# Patient Record
Sex: Male | Born: 1975 | Hispanic: No | Marital: Married | Smoking: Never smoker
Health system: Southern US, Community
[De-identification: ages and names within clinical notes are randomized; demographics above are authoritative.]

---

## 2010-01-03 ENCOUNTER — Ambulatory Visit: Payer: Self-pay | Admitting: Family Medicine

## 2010-01-07 ENCOUNTER — Ambulatory Visit: Payer: Self-pay | Admitting: Family Medicine

## 2010-01-08 LAB — CONVERTED CEMR LAB
AST: 27 units/L (ref 0–37)
Albumin: 4 g/dL (ref 3.5–5.2)
Alkaline Phosphatase: 62 units/L (ref 39–117)
BUN: 13 mg/dL (ref 6–23)
Basophils Absolute: 0 10*3/uL (ref 0.0–0.1)
Bilirubin, Direct: 0.2 mg/dL (ref 0.0–0.3)
Chloride: 107 meq/L (ref 96–112)
Cholesterol: 236 mg/dL — ABNORMAL HIGH (ref 0–200)
Direct LDL: 146.9 mg/dL
Glucose, Bld: 94 mg/dL (ref 70–99)
Hemoglobin: 15.4 g/dL (ref 13.0–17.0)
Ketones, ur: NEGATIVE mg/dL
Lymphocytes Relative: 31.5 % (ref 12.0–46.0)
Monocytes Relative: 7.2 % (ref 3.0–12.0)
Neutro Abs: 4.1 10*3/uL (ref 1.4–7.7)
Neutrophils Relative %: 59 % (ref 43.0–77.0)
Potassium: 4.1 meq/L (ref 3.5–5.1)
RBC: 5.05 M/uL (ref 4.22–5.81)
RDW: 12.4 % (ref 11.5–14.6)
Specific Gravity, Urine: 1.025 (ref 1.000–1.030)
Total CHOL/HDL Ratio: 4
Total Protein, Urine: NEGATIVE mg/dL
Urine Glucose: NEGATIVE mg/dL
Urobilinogen, UA: 0.2 (ref 0.0–1.0)
VLDL: 48.4 mg/dL — ABNORMAL HIGH (ref 0.0–40.0)
pH: 6 (ref 5.0–8.0)

## 2010-09-02 ENCOUNTER — Encounter
Admission: RE | Admit: 2010-09-02 | Discharge: 2010-09-02 | Payer: Self-pay | Source: Home / Self Care | Attending: Specialist | Admitting: Specialist

## 2010-09-30 NOTE — Assessment & Plan Note (Signed)
Summary: New to establish   Vital Signs:  Patient profile:   35 year old male Height:      70.50 inches Weight:      214 pounds BMI:     30.38 Temp:     98.8 degrees F oral Pulse rate:   60 / minute Pulse rhythm:   regular Resp:     12 per minute BP sitting:   98 / 64  (left arm) Cuff size:   regular  Vitals Entered By: Sid Falcon LPN (Jan 03, 1609 3:14 PM)  Nutrition Counseling: Patient's BMI is greater than 25 and therefore counseled on weight management options. CC: New to establish   History of Present Illness: New patient to establish care.  Patient here for complete physical. He is not fasting today. Past medical history reveals no chronic illness. No prior surgeries. Takes no medications. No known drug allergies.  Family history significant for father and brother with hypertension. Uncle with diabetes. Mother died age 49 following accident.  Patient is a Sport and exercise psychologist. Nonsmoker. One alcoholic beverage per week. Married with 2 children ages 92 and 44.  Recently started exercise. Has gained approximately 10 pounds over the past couple of years. Last tetanus unknown.  Preventive Screening-Counseling & Management  Alcohol-Tobacco     Smoking Status: never  Allergies (verified): No Known Drug Allergies  Past History:  Family History: Last updated: 01/03/2010 Family History of Arthritis Breast cancer, aunt Hypertension  Brother and Father  Social History: Last updated: 01/03/2010 Occupation: Sport and exercise psychologist Married Never Smoked Alcohol use-yes  Risk Factors: Smoking Status: never (01/03/2010)  Past Surgical History: none  Family History: Family History of Arthritis Breast cancer, aunt Hypertension  Brother and Father  Social History: Occupation: Sport and exercise psychologist Married Never Smoked Alcohol use-yes Smoking Status:  never Occupation:  employed  Review of Systems  The patient denies anorexia, fever, weight loss, weight gain,  vision loss, decreased hearing, hoarseness, chest pain, syncope, dyspnea on exertion, peripheral edema, prolonged cough, headaches, hemoptysis, abdominal pain, melena, hematochezia, severe indigestion/heartburn, hematuria, incontinence, genital sores, muscle weakness, suspicious skin lesions, transient blindness, difficulty walking, depression, unusual weight change, abnormal bleeding, enlarged lymph nodes, and testicular masses.    Physical Exam  General:  Well-developed,well-nourished,in no acute distress; alert,appropriate and cooperative throughout examination Eyes:  No corneal or conjunctival inflammation noted. EOMI. Perrla. Funduscopic exam benign, without hemorrhages, exudates or papilledema. Vision grossly normal. Ears:  External ear exam shows no significant lesions or deformities.  Otoscopic examination reveals clear canals, tympanic membranes are intact bilaterally without bulging, retraction, inflammation or discharge. Hearing is grossly normal bilaterally. Mouth:  Oral mucosa and oropharynx without lesions or exudates.  Teeth in good repair. Neck:  No deformities, masses, or tenderness noted. Lungs:  Normal respiratory effort, chest expands symmetrically. Lungs are clear to auscultation, no crackles or wheezes. Heart:  Normal rate and regular rhythm. S1 and S2 normal without gallop, murmur, click, rub or other extra sounds. Abdomen:  Bowel sounds positive,abdomen soft and non-tender without masses, organomegaly or hernias noted. Genitalia:  Testes bilaterally descended without nodularity, tenderness or masses. No scrotal masses or lesions. No penis lesions or urethral discharge. Msk:  No deformity or scoliosis noted of thoracic or lumbar spine.   Extremities:  No clubbing, cyanosis, edema, or deformity noted with normal full range of motion of all joints.   Neurologic:  No cranial nerve deficits noted. Station and gait are normal. Plantar reflexes are down-going bilaterally. DTRs are  symmetrical throughout. Sensory, motor  and coordinative functions appear intact. Skin:  Intact without suspicious lesions or rashes Cervical Nodes:  No lymphadenopathy noted Psych:  normally interactive, good eye contact, not anxious appearing, and not depressed appearing.     Impression & Recommendations:  Problem # 1:  Preventive Health Care (ICD-V70.0) discussed regular exercise. Patient will schedule for screening lab work. Tetanus booster will be given.  Other Orders: Tdap => 57yrs IM (16109) Admin 1st Vaccine (60454)  Patient Instructions: 1)  It is important that you exercise reguarly at least 20 minutes 5 times a week. If you develop chest pain, have severe difficulty breathing, or feel very tired, stop exercising immediately and seek medical attention.  2)  You need to lose weight. Consider a lower calorie diet and regular exercise.  3)  Schedule the following labs: 4)  lipid, hepatic, BMP, CBC, TSH  (V70.0)    Immunizations Administered:  Tetanus Vaccine:    Vaccine Type: Tdap    Site: left deltoid    Mfr: GlaxoSmithKline    Dose: 0.5 ml    Route: IM    Given by: Sid Falcon LPN    Exp. Date: 11/23/2011    Lot #: [id number]

## 2011-05-16 IMAGING — CR DG CHEST 1V
1 series · 1 of 1 positions shown · non-contrast
Comparison: None.

CLINICAL DATA: Positive PPD

CHEST - 1 VIEW

[w chest pa]
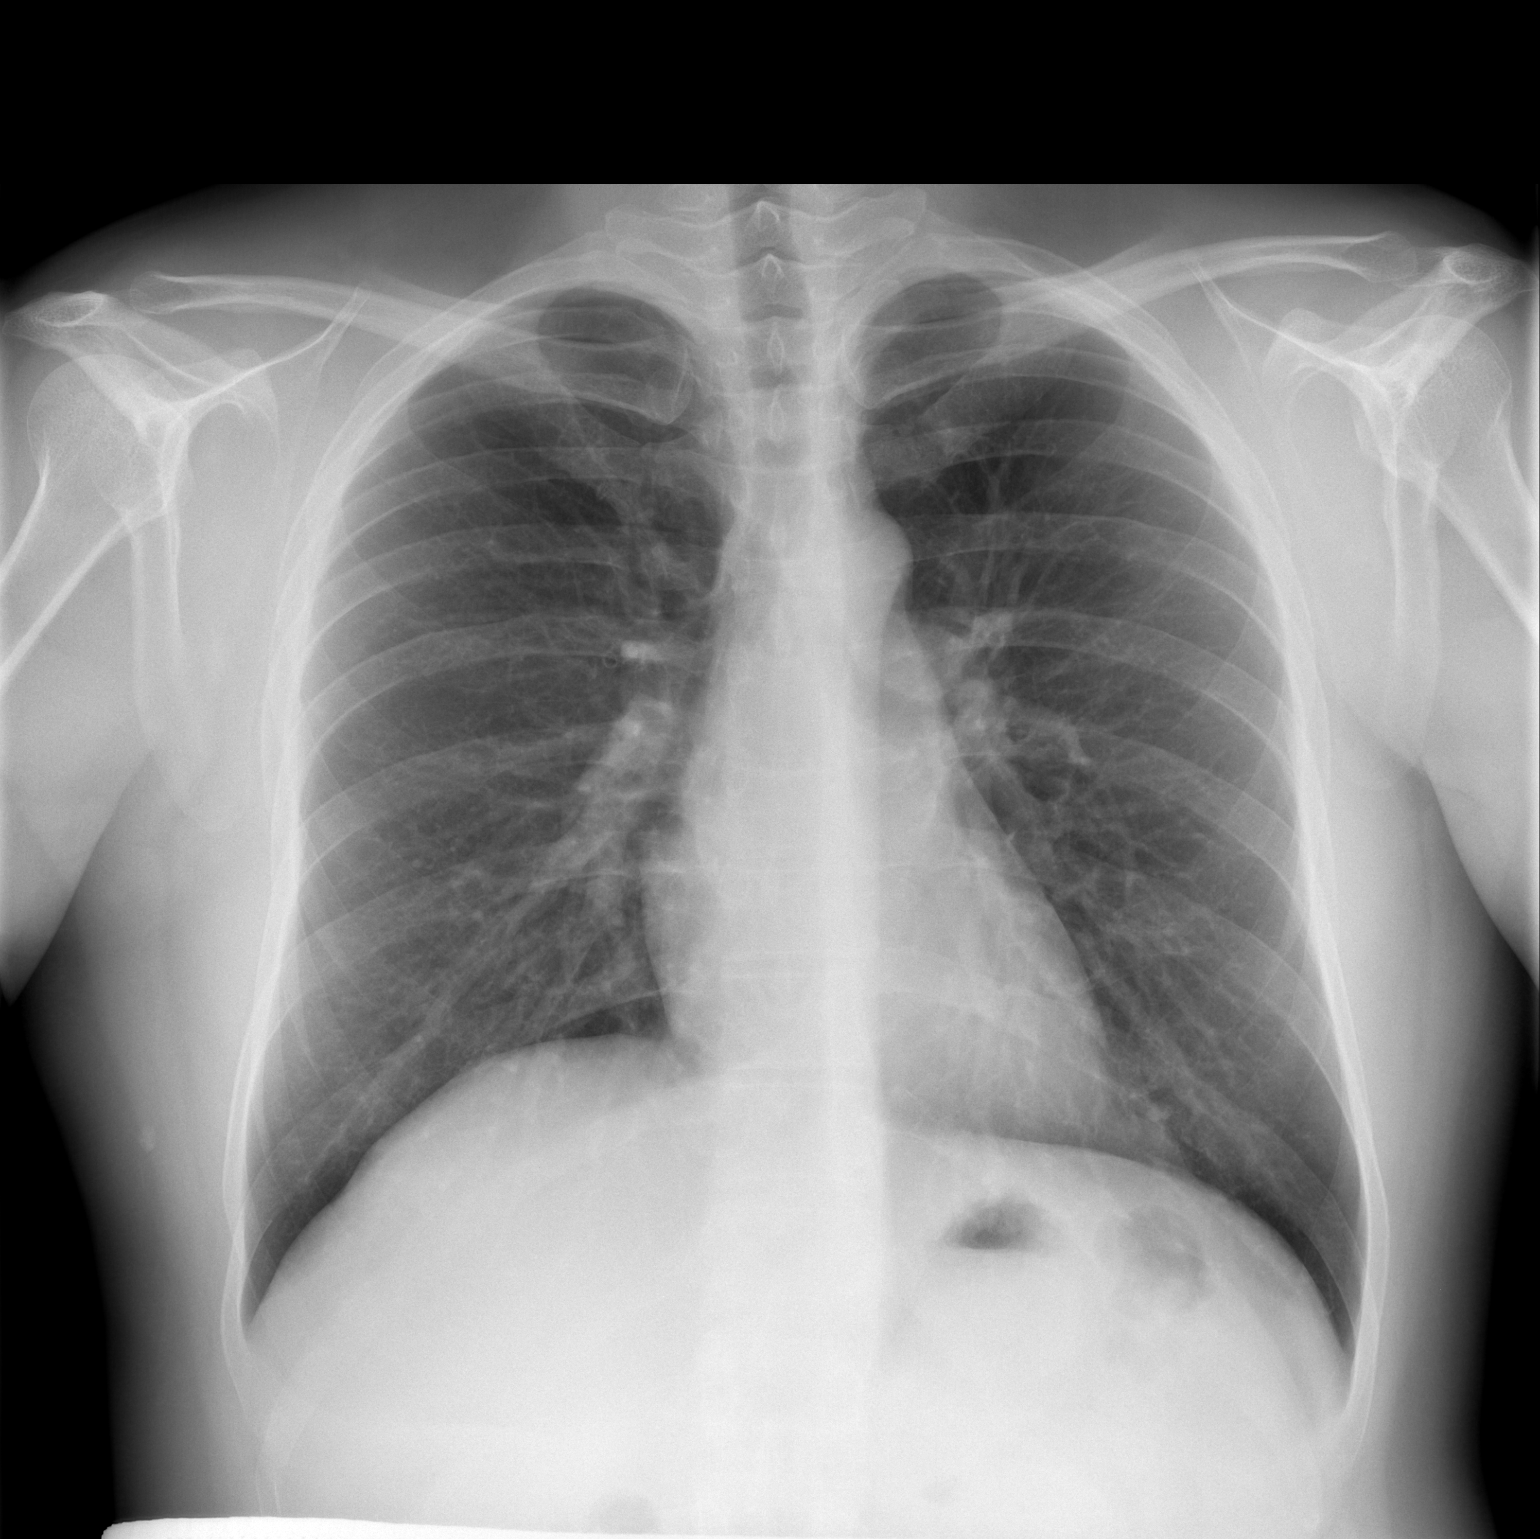

[1 of 1 positions shown; findings below may reference images not displayed]

FINDINGS: The lungs are clear.  Mediastinal contours are normal.
The heart is within normal limits in size.  No bony abnormality is
seen.
IMPRESSION: No active lung disease.

## 2011-07-30 ENCOUNTER — Other Ambulatory Visit (INDEPENDENT_AMBULATORY_CARE_PROVIDER_SITE_OTHER): Payer: 59

## 2011-07-30 DIAGNOSIS — Z0000 Encounter for general adult medical examination without abnormal findings: Secondary | ICD-10-CM

## 2011-07-30 LAB — POCT URINALYSIS DIPSTICK
Bilirubin, UA: NEGATIVE
Ketones, UA: NEGATIVE
Protein, UA: NEGATIVE
Spec Grav, UA: 1.025

## 2011-07-30 LAB — CBC WITH DIFFERENTIAL/PLATELET
Basophils Absolute: 0 10*3/uL (ref 0.0–0.1)
Basophils Relative: 0.5 % (ref 0.0–3.0)
Eosinophils Absolute: 0.1 10*3/uL (ref 0.0–0.7)
HCT: 43.8 % (ref 39.0–52.0)
Hemoglobin: 15.1 g/dL (ref 13.0–17.0)
Lymphs Abs: 1.9 10*3/uL (ref 0.7–4.0)
MCHC: 34.4 g/dL (ref 30.0–36.0)
Neutro Abs: 4.8 10*3/uL (ref 1.4–7.7)
RBC: 4.99 Mil/uL (ref 4.22–5.81)
RDW: 12.4 % (ref 11.5–14.6)

## 2011-07-30 LAB — BASIC METABOLIC PANEL
CO2: 24 mEq/L (ref 19–32)
Chloride: 109 mEq/L (ref 96–112)
Glucose, Bld: 97 mg/dL (ref 70–99)
Potassium: 4.1 mEq/L (ref 3.5–5.1)
Sodium: 141 mEq/L (ref 135–145)

## 2011-07-30 LAB — LDL CHOLESTEROL, DIRECT: Direct LDL: 151.3 mg/dL

## 2011-07-30 LAB — LIPID PANEL
HDL: 56.9 mg/dL (ref >39.00)
Triglycerides: 288 mg/dL — ABNORMAL HIGH (ref 0.0–149.0)

## 2011-07-30 LAB — HEPATIC FUNCTION PANEL
AST: 25 U/L (ref 0–37)
Albumin: 4.2 g/dL (ref 3.5–5.2)
Alkaline Phosphatase: 61 U/L (ref 39–117)
Total Protein: 7.7 g/dL (ref 6.0–8.3)

## 2011-07-30 LAB — TSH: TSH: 1.3 u[IU]/mL (ref 0.35–5.50)

## 2011-08-06 ENCOUNTER — Ambulatory Visit (INDEPENDENT_AMBULATORY_CARE_PROVIDER_SITE_OTHER): Payer: 59 | Admitting: Family Medicine

## 2011-08-06 ENCOUNTER — Encounter: Payer: Self-pay | Admitting: Family Medicine

## 2011-08-06 VITALS — BP 110/80 | HR 72 | Temp 99.1°F | Resp 12 | Ht 71.0 in | Wt 222.0 lb

## 2011-08-06 DIAGNOSIS — Z0000 Encounter for general adult medical examination without abnormal findings: Secondary | ICD-10-CM

## 2011-08-06 DIAGNOSIS — Z23 Encounter for immunization: Secondary | ICD-10-CM

## 2011-08-06 DIAGNOSIS — R319 Hematuria, unspecified: Secondary | ICD-10-CM

## 2011-08-06 NOTE — Patient Instructions (Signed)
Work on weight loss and reduction of saturated fats in diet Consider repeat lipid panel in one year Schedule followup in approximately one month for repeat urinalysis

## 2011-08-06 NOTE — Progress Notes (Signed)
  Subjective:    Patient ID: Jose Barker, male    DOB: 09/17/1975, 35 y.o.   MRN: [id number]  HPI  Patient seen for a complete physical. No chronic medical problems. No prior surgeries. Takes no medications. Tetanus last year. No flu vaccine yet. No regular exercise and has gained about 12 poinds this last year. Plans to start exercising. Past medical history family history reviewed as below. Patient nonsmoker. No regular alcohol use.  History reviewed. No pertinent past medical history. History reviewed. No pertinent past surgical history.  reports that he has never smoked. He does not have any smokeless tobacco history on file. His alcohol and drug histories not on file. family history includes Arthritis in his other; Cancer in his other; Hypertension in his brother and father; and Parkinsonism in his father. No Known Allergies   Review of Systems  Constitutional: Negative for fever, chills, activity change, appetite change, fatigue and unexpected weight change.  HENT: Negative for ear pain, congestion and trouble swallowing.   Eyes: Negative for pain and visual disturbance.  Respiratory: Negative for cough, shortness of breath and wheezing.   Cardiovascular: Negative for chest pain and palpitations.  Gastrointestinal: Negative for nausea, vomiting, abdominal pain, diarrhea, constipation, blood in stool, abdominal distention and rectal pain.  Genitourinary: Negative for dysuria, hematuria and testicular pain.  Musculoskeletal: Negative for joint swelling and arthralgias.  Skin: Negative for rash.  Neurological: Negative for dizziness, syncope and headaches.  Hematological: Negative for adenopathy.  Psychiatric/Behavioral: Negative for confusion and dysphoric mood.       Objective:   Physical Exam  Constitutional: He is oriented to person, place, and time. He appears well-developed and well-nourished. No distress.  HENT:  Head: Normocephalic and atraumatic.  Right Ear: External ear  normal.  Left Ear: External ear normal.  Mouth/Throat: Oropharynx is clear and moist.  Eyes: Conjunctivae and EOM are normal. Pupils are equal, round, and reactive to light.  Neck: Normal range of motion. Neck supple. No thyromegaly present.  Cardiovascular: Normal rate, regular rhythm and normal heart sounds.   No murmur heard. Pulmonary/Chest: No respiratory distress. He has no wheezes. He has no rales.  Abdominal: Soft. Bowel sounds are normal. He exhibits no distension and no mass. There is no tenderness. There is no rebound and no guarding.  Musculoskeletal: He exhibits no edema.  Lymphadenopathy:    He has no cervical adenopathy.  Neurological: He is alert and oriented to person, place, and time. He displays normal reflexes. No cranial nerve deficit.  Skin: No rash noted.  Psychiatric: He has a normal mood and affect.          Assessment & Plan:  Complete physical. Labs reviewed with patient. Moderately elevated lipids. Needs to lose some weight. Discussed diet and exercise in detail. Repeat urine dipstick and 2-3 weeks to make sure hematuria clearing-doubt significant with only trace blood . Flu vaccine given.

## 2011-09-11 ENCOUNTER — Other Ambulatory Visit (INDEPENDENT_AMBULATORY_CARE_PROVIDER_SITE_OTHER): Payer: 59

## 2011-09-11 DIAGNOSIS — R319 Hematuria, unspecified: Secondary | ICD-10-CM

## 2011-09-11 LAB — POCT URINALYSIS DIPSTICK
Bilirubin, UA: NEGATIVE
Glucose, UA: NEGATIVE
Ketones, UA: NEGATIVE
Leukocytes, UA: NEGATIVE
Nitrite, UA: NEGATIVE
Protein, UA: NEGATIVE
Spec Grav, UA: 1.01
Urobilinogen, UA: 0.2
pH, UA: 6

## 2012-08-03 ENCOUNTER — Other Ambulatory Visit (INDEPENDENT_AMBULATORY_CARE_PROVIDER_SITE_OTHER): Payer: 59

## 2012-08-03 DIAGNOSIS — Z0000 Encounter for general adult medical examination without abnormal findings: Secondary | ICD-10-CM

## 2012-08-03 LAB — HEPATIC FUNCTION PANEL
Albumin: 4.1 g/dL (ref 3.5–5.2)
Alkaline Phosphatase: 63 U/L (ref 39–117)
Bilirubin, Direct: 0.1 mg/dL (ref 0.0–0.3)
Total Bilirubin: 0.8 mg/dL (ref 0.3–1.2)
Total Protein: 7.4 g/dL (ref 6.0–8.3)

## 2012-08-03 LAB — BASIC METABOLIC PANEL
BUN: 13 mg/dL (ref 6–23)
GFR: 111.09 mL/min (ref >60.00)
Potassium: 3.9 mEq/L (ref 3.5–5.1)
Sodium: 138 mEq/L (ref 135–145)

## 2012-08-03 LAB — LIPID PANEL
Cholesterol: 232 mg/dL — ABNORMAL HIGH (ref 0–200)
Triglycerides: 252 mg/dL — ABNORMAL HIGH (ref 0.0–149.0)

## 2012-08-03 LAB — CBC WITH DIFFERENTIAL/PLATELET
Eosinophils Relative: 1.5 % (ref 0.0–5.0)
HCT: 45.3 % (ref 39.0–52.0)
Lymphs Abs: 1.8 10*3/uL (ref 0.7–4.0)
Monocytes Relative: 8 % (ref 3.0–12.0)
Platelets: 240 10*3/uL (ref 150.0–400.0)
WBC: 5.9 10*3/uL (ref 4.5–10.5)

## 2012-08-03 LAB — POCT URINALYSIS DIPSTICK
Glucose, UA: NEGATIVE
Leukocytes, UA: NEGATIVE
Nitrite, UA: NEGATIVE
Protein, UA: NEGATIVE
Urobilinogen, UA: 0.2

## 2012-08-09 ENCOUNTER — Encounter: Payer: Self-pay | Admitting: Family Medicine

## 2012-08-09 ENCOUNTER — Ambulatory Visit (INDEPENDENT_AMBULATORY_CARE_PROVIDER_SITE_OTHER): Payer: 59 | Admitting: Family Medicine

## 2012-08-09 VITALS — BP 110/78 | HR 72 | Temp 98.3°F | Resp 12 | Ht 70.75 in | Wt 225.0 lb

## 2012-08-09 DIAGNOSIS — M722 Plantar fascial fibromatosis: Secondary | ICD-10-CM

## 2012-08-09 DIAGNOSIS — Z0000 Encounter for general adult medical examination without abnormal findings: Secondary | ICD-10-CM

## 2012-08-09 DIAGNOSIS — L219 Seborrheic dermatitis, unspecified: Secondary | ICD-10-CM

## 2012-08-09 MED ORDER — FLUOCINOLONE ACETONIDE 0.01 % EX SHAM: 1.0000 "application " | MEDICATED_SHAMPOO | Freq: Every day | CUTANEOUS | Status: DC | PRN

## 2012-08-09 NOTE — Progress Notes (Signed)
Subjective:    Patient ID: Jose Barker, male    DOB: 10-Mar-1976, 36 y.o.   MRN: [id number]  HPI  Patient here for complete physical. He has no specific chronic medical problems. Takes no medications. He has recently acquired a treadmill and plans to start exercising soon. Had steady weight gain within past few years. 3 pound weight gain since last year. Tetanus up-to-date. Already had flu vaccine.  Has problems with seborrhea and scaling of the scalp. Minimal itching. Tried some type of conditioning shampoo without much improvement. He has some mild diffuse hair loss and states is a genetic trait in his family.  Left heel pain over the past several weeks. No injury. Pain is worse after prolonged periods of sitting or rest. Location is calcaneus. No Achilles pain. Is not stretching. No alleviating factors.  No past medical history on file. No past surgical history on file.  reports that he has never smoked. He does not have any smokeless tobacco history on file. His alcohol and drug histories not on file. family history includes Arthritis in his other; Cancer in his other; Cancer (age of onset:42) in his sister; Hypertension in his brother and father; and Parkinsonism in his father. No Known Allergies    Review of Systems  Constitutional: Negative for fever, activity change, appetite change and fatigue.  HENT: Negative for ear pain, congestion and trouble swallowing.   Eyes: Negative for pain and visual disturbance.  Respiratory: Negative for cough, shortness of breath and wheezing.   Cardiovascular: Negative for chest pain and palpitations.  Gastrointestinal: Negative for nausea, vomiting, abdominal pain, diarrhea, constipation, blood in stool, abdominal distention and rectal pain.  Genitourinary: Negative for dysuria, hematuria and testicular pain.  Musculoskeletal: Negative for joint swelling and arthralgias.  Skin: Negative for rash.  Neurological: Negative for dizziness, syncope  and headaches.  Hematological: Negative for adenopathy.  Psychiatric/Behavioral: Negative for confusion and dysphoric mood.       Objective:   Physical Exam  Constitutional: He is oriented to person, place, and time. He appears well-developed and well-nourished. No distress.  HENT:  Head: Normocephalic and atraumatic.  Right Ear: External ear normal.  Left Ear: External ear normal.  Mouth/Throat: Oropharynx is clear and moist.  Eyes: Conjunctivae normal and EOM are normal. Pupils are equal, round, and reactive to light.  Neck: Normal range of motion. Neck supple. No thyromegaly present.  Cardiovascular: Normal rate, regular rhythm and normal heart sounds.   No murmur heard. Pulmonary/Chest: No respiratory distress. He has no wheezes. He has no rales.  Abdominal: Soft. Bowel sounds are normal. He exhibits no distension and no mass. There is no tenderness. There is no rebound and no guarding.  Musculoskeletal: He exhibits no edema.       Tender plantar fascia.  No achilles tenderness.  Full ROM ankle.  Lymphadenopathy:    He has no cervical adenopathy.  Neurological: He is alert and oriented to person, place, and time. He displays normal reflexes. No cranial nerve deficit.  Skin: No rash noted.       Mild seborrhea,scaling of scalp  Psychiatric: He has a normal mood and affect.          Assessment & Plan:  Complete physical. Labs reviewed with patient with no specific concerns. Does have mildly elevated blood sugar 101 which is up from last year. We discussed the importance of weight loss and establishing more consistent exercise. He recently acquired treadmill and plans to start program soon.  Plantar fasciitis. We  recommended stretches with instructions given. He'll consider over-the-counter heel cup. Consider orthotics if symptoms persist  seborrhea involving scalp. Try over-the-counter T-Gel or Selsun Blue shampoos. Prescription for Capex shampoo if these are not relieving

## 2012-08-09 NOTE — Patient Instructions (Addendum)
Plantar Fasciitis Plantar fasciitis is a common condition that causes foot pain. It is soreness (inflammation) of the band of tough fibrous tissue on the bottom of the foot that runs from the heel bone (calcaneus) to the ball of the foot. The cause of this soreness may be from excessive standing, poor fitting shoes, running on hard surfaces, being overweight, having an abnormal walk, or overuse (this is common in runners) of the painful foot or feet. It is also common in aerobic exercise dancers and ballet dancers. SYMPTOMS  Most people with plantar fasciitis complain of:  Severe pain in the morning on the bottom of their foot especially when taking the first steps out of bed. This pain recedes after a few minutes of walking.  Severe pain is experienced also during walking following a long period of inactivity.  Pain is worse when walking barefoot or up stairs DIAGNOSIS   Your caregiver will diagnose this condition by examining and feeling your foot.  Special tests such as X-rays of your foot, are usually not needed. PREVENTION   Consult a sports medicine professional before beginning a new exercise program.  Walking programs offer a good workout. With walking there is a lower chance of overuse injuries common to runners. There is less impact and less jarring of the joints.  Begin all new exercise programs slowly. If problems or pain develop, decrease the amount of time or distance until you are at a comfortable level.  Wear good shoes and replace them regularly.  Stretch your foot and the heel cords at the back of the ankle (Achilles tendon) both before and after exercise.  Run or exercise on even surfaces that are not hard. For example, asphalt is better than pavement.  Do not run barefoot on hard surfaces.  If using a treadmill, vary the incline.  Do not continue to workout if you have foot or joint problems. Seek professional help if they do not improve. HOME CARE INSTRUCTIONS     Avoid activities that cause you pain until you recover.  Use ice or cold packs on the problem or painful areas after working out.  Only take over-the-counter or prescription medicines for pain, discomfort, or fever as directed by your caregiver.  Soft shoe inserts or athletic shoes with air or gel sole cushions may be helpful.  If problems continue or become more severe, consult a sports medicine caregiver or your own health care provider. Cortisone is a potent anti-inflammatory medication that may be injected into the painful area. You can discuss this treatment with your caregiver. MAKE SURE YOU:   Understand these instructions.  Will watch your condition.  Will get help right away if you are not doing well or get worse. Document Released: 05/12/2001 Document Revised: 11/09/2011 Document Reviewed: 07/11/2008 [facility] Patient Information 2013 [place], [place].  Consider T-gel or Selsun Blue dandriff shampoos.

## 2012-12-20 ENCOUNTER — Ambulatory Visit (INDEPENDENT_AMBULATORY_CARE_PROVIDER_SITE_OTHER): Payer: 59 | Admitting: Family Medicine

## 2012-12-20 ENCOUNTER — Encounter: Payer: Self-pay | Admitting: Family Medicine

## 2012-12-20 VITALS — BP 100/60 | HR 88 | Temp 98.2°F | Wt 204.0 lb

## 2012-12-20 DIAGNOSIS — T148XXA Other injury of unspecified body region, initial encounter: Secondary | ICD-10-CM

## 2012-12-20 NOTE — Progress Notes (Addendum)
Chief Complaint  Patient presents with  . Fall    felt dizzy during the night; hurt his ribs on the right side; had advil for pain    HPI:  Acute visit for ? Rib injury: -fell when got up to urinate last night and hit right side and has had pain here -reports for at least 15 years since a teenager sometimes feel a little dizzy when urinating (especially when gets up suddenly at night) - never with LOC, CP, SOB or palpitations - this was what happened last night -pain is soreness on R lower rib area -motrin helps pain, slept well -feels ok right now on advil -hurts with certain movements  ROS: See pertinent positives and negatives per HPI.  No past medical history on file.  Family History  Problem Relation Age of Onset  . Hypertension Father   . Parkinsonism Father   . Hypertension Brother   . Arthritis Other   . Cancer Other     breast, aunt  . Cancer Sister 75    breast cancer    History   Social History  . Marital Status: Married    Spouse Name: N/A    Number of Children: N/A  . Years of Education: N/A   Social History Main Topics  . Smoking status: Never Smoker   . Smokeless tobacco: None  . Alcohol Use: None  . Drug Use: None  . Sexually Active: None   Other Topics Concern  . None   Social History Narrative  . None    Current outpatient prescriptions:Fluocinolone Acetonide (CAPEX) 0.01 % SHAM, Apply 1 application topically daily as needed., Disp: 1 Bottle, Rfl: 2;  Multiple Vitamin (MULTI VITAMIN MENS PO), Take by mouth daily., Disp: , Rfl:   EXAM:  Filed Vitals:   12/20/12 0915  BP: 100/60  Pulse: 88  Temp: 98.2 F (36.8 C)    Body mass index is 28.66 kg/(m^2).  GENERAL: vitals reviewed and listed above, alert, oriented, appears well hydrated and in no acute distress  HEENT: atraumatic, conjunttiva clear, no obvious abnormalities on inspection of external nose and ears  NECK: no obvious masses on inspection  LUNGS: clear to auscultation  bilaterally, no wheezes, rales or rhonchi, good air movement  CV: HRRR, no peripheral edema  MS: moves all extremities without noticeable abnormality Mild defuse TTP soft tissues R side of mid and lower ribs in axillary line, no bony TTP  PSYCH: pleasant and cooperative, no obvious depression or anxiety  ASSESSMENT AND PLAN:  Discussed the following assessment and plan:  Contusion of soft tissue  -advised heat, ibuprofen or tylenol, gentle stretching - doubt any rib fx per exam but offered xray - pt will hold of on this. Follow up with PCP in 3 weeks. -sounds like he has had a mild micturition related presyncope his whole life, not new and do not think further workup needed at this time, he agreed. -Patient advised to return or notify a doctor immediately if symptoms worsen or persist or new concerns arise.  There are no Patient Instructions on file for this visit.   Kriste Basque R.

## 2013-05-21 ENCOUNTER — Encounter (HOSPITAL_COMMUNITY): Payer: Self-pay | Admitting: *Deleted

## 2013-05-21 ENCOUNTER — Emergency Department (HOSPITAL_COMMUNITY)
Admission: EM | Admit: 2013-05-21 | Discharge: 2013-05-21 | Disposition: A | Discharge status: Home / Self Care | Payer: 59 | Attending: Emergency Medicine | Admitting: Emergency Medicine

## 2013-05-21 DIAGNOSIS — Z23 Encounter for immunization: Secondary | ICD-10-CM | POA: Insufficient documentation

## 2013-05-21 DIAGNOSIS — S61409A Unspecified open wound of unspecified hand, initial encounter: Secondary | ICD-10-CM | POA: Insufficient documentation

## 2013-05-21 DIAGNOSIS — Y929 Unspecified place or not applicable: Secondary | ICD-10-CM | POA: Insufficient documentation

## 2013-05-21 DIAGNOSIS — Z79899 Other long term (current) drug therapy: Secondary | ICD-10-CM | POA: Insufficient documentation

## 2013-05-21 DIAGNOSIS — Y939 Activity, unspecified: Secondary | ICD-10-CM | POA: Insufficient documentation

## 2013-05-21 DIAGNOSIS — W268XXA Contact with other sharp object(s), not elsewhere classified, initial encounter: Secondary | ICD-10-CM | POA: Insufficient documentation

## 2013-05-21 DIAGNOSIS — S61412A Laceration without foreign body of left hand, initial encounter: Secondary | ICD-10-CM

## 2013-05-21 MED ORDER — TETANUS-DIPHTH-ACELL PERTUSSIS 5-2.5-18.5 LF-MCG/0.5 IM SUSP
0.5000 mL | Freq: Once | INTRAMUSCULAR | Status: AC
  Administered 2013-05-21: 0.5 mL via INTRAMUSCULAR
  Filled 2013-05-21: qty 0.5

## 2013-05-21 NOTE — ED Notes (Signed)
The pt lacerated his lt thumb on a glass 2 hours ago.. Web space of his thumb and index finger

## 2013-05-21 NOTE — ED Provider Notes (Signed)
Medical screening examination/treatment/procedure(s) were performed by non-physician practitioner and as supervising physician I was immediately available for consultation/collaboration.   Joya Gaskins, MD 05/21/13 [phone]

## 2013-05-21 NOTE — ED Provider Notes (Signed)
CSN: [id number]     Arrival date & time 05/21/13  0009 History   First MD Initiated Contact with Patient 05/21/13 0024     Chief Complaint  Patient presents with  . Laceration   (Consider location/radiation/quality/duration/timing/severity/associated sxs/prior Treatment) HPI Comments: Patient is a 37 year old male who presents with a laceration of his left hand that occurred prior to arrival. Patient states he was holding a glass that broke, resulting in a laceration of his left hand. Patient reports mild to moderate pain at the laceration site that started suddenly. Patient reports associated bleeding. Patient held pressure on the wound to control bleeding. No aggravating/alleviating factors. No other injury.   Patient is a 37 y.o. male presenting with skin laceration.  Laceration   History reviewed. No pertinent past medical history. History reviewed. No pertinent past surgical history. Family History  Problem Relation Age of Onset  . Hypertension Father   . Parkinsonism Father   . Hypertension Brother   . Arthritis Other   . Cancer Other     breast, aunt  . Cancer Sister 56    breast cancer   History  Substance Use Topics  . Smoking status: Never Smoker   . Smokeless tobacco: Not on file  . Alcohol Use: Yes    Review of Systems  Skin: Positive for wound.  All other systems reviewed and are negative.    Allergies  Review of patient's allergies indicates no known allergies.  Home Medications   Current Outpatient Rx  Name  Route  Sig  Dispense  Refill  . Fluocinolone Acetonide (CAPEX) 0.01 % SHAM   Apply externally   Apply 1 application topically daily as needed.   1 Bottle   2   . Multiple Vitamin (MULTI VITAMIN MENS PO)   Oral   Take by mouth daily.          BP 156/83  Pulse 124  Temp(Src) 98.9 F (37.2 C) (Oral)  Resp 16  SpO2 99% Physical Exam  Nursing note and vitals reviewed. Constitutional: He is oriented to person, place, and time. He  appears well-developed and well-nourished. No distress.  HENT:  Head: Normocephalic and atraumatic.  Eyes: Conjunctivae are normal.  Neck: Normal range of motion.  Cardiovascular: Normal rate, regular rhythm and intact distal pulses.  Exam reveals no gallop and no friction rub.   No murmur heard. Sufficient capillary refill of left hand digits.   Pulmonary/Chest: Effort normal and breath sounds normal. He has no wheezes. He has no rales. He exhibits no tenderness.  Musculoskeletal: Normal range of motion.  Full ROM of joints of left hand.   Neurological: He is alert and oriented to person, place, and time. Coordination normal.  Left hand sensation intact. Speech is goal-oriented. Moves limbs without ataxia.   Skin: Skin is warm and dry.  2.5 cm laceration of left hand in interspace of thumb and index finger on volar aspect of hand. Bleeding controlled.   Psychiatric: He has a normal mood and affect. His behavior is normal.    ED Course  Procedures (including critical care time)  LACERATION REPAIR Performed by: Emilia Beck Authorized by: Emilia Beck Consent: Verbal consent obtained. Risks and benefits: risks, benefits and alternatives were discussed Consent given by: patient Patient identity confirmed: provided demographic data Prepped and Draped in normal sterile fashion Wound explored  Laceration Location: left hand thumb and index finger interspace  Laceration Length: 2.5 cm  No Foreign Bodies seen or palpated  Anesthesia: local  infiltration  Local anesthetic: lidocaine 2% without epinephrine  Anesthetic total: 3 ml  Irrigation method: syringe Amount of cleaning: standard  Skin closure: 4-0 prolene  Number of sutures: 3  Technique: simple  Patient tolerance: Patient tolerated the procedure well with no immediate complications.   Labs Review Labs Reviewed - No data to display Imaging Review No results found.  MDM   1. Laceration of left hand,  initial encounter     1:27 AM Laceration repaired without complication and bandage applied. No foreign bodies noted in wound, as it was thoroughly explored. Patient receive tdap vaccine here. No neurovascular compromise. Patient instructed to return in 10 days for suture removal.     Emilia Beck, PA-C 05/21/13 [phone]

## 2013-05-30 ENCOUNTER — Encounter: Payer: Self-pay | Admitting: Family Medicine

## 2013-05-30 ENCOUNTER — Ambulatory Visit (INDEPENDENT_AMBULATORY_CARE_PROVIDER_SITE_OTHER): Payer: 59 | Admitting: Family Medicine

## 2013-05-30 VITALS — BP 112/70 | HR 83 | Temp 98.4°F | Wt 217.0 lb

## 2013-05-30 DIAGNOSIS — S61409A Unspecified open wound of unspecified hand, initial encounter: Secondary | ICD-10-CM

## 2013-05-30 DIAGNOSIS — S61412A Laceration without foreign body of left hand, initial encounter: Secondary | ICD-10-CM

## 2013-05-30 NOTE — Progress Notes (Signed)
  Subjective:    Patient ID: Jose Barker, male    DOB: 07/26/1976, 37 y.o.   MRN: [id number]  HPI  Recent laceration left hand about 10 days ago. This was cut accidentally on an ice maker. He was given tetanus booster. He was seen in the emergency department. He had no pain and no difficulties with healing. No drainage.  No past medical history on file. No past surgical history on file.  reports that he has never smoked. He does not have any smokeless tobacco history on file. He reports that  drinks alcohol. His drug history is not on file. family history includes Arthritis in his other; Cancer in his other; Cancer (age of onset: 85) in his sister; Hypertension in his brother and father; Parkinsonism in his father. No Known Allergies   Review of Systems  Constitutional: Negative for fever and chills.       Objective:   Physical Exam  Constitutional: He appears well-developed and well-nourished.  Skin:  Left hand reveals laceration in the webspace between the thumb and index finger. This is healing well. 3 sutures are removed. No signs of infection. Wound is partially opened up in one section and Steri-Strips are applied          Assessment & Plan:  Left hand laceration healing well. Sutures removed. Steri-Strips applied. Followup as needed

## 2014-04-10 ENCOUNTER — Other Ambulatory Visit (INDEPENDENT_AMBULATORY_CARE_PROVIDER_SITE_OTHER): Payer: 59

## 2014-04-10 DIAGNOSIS — Z0000 Encounter for general adult medical examination without abnormal findings: Secondary | ICD-10-CM

## 2014-04-10 LAB — HEPATIC FUNCTION PANEL
ALT: 22 U/L (ref 0–53)
AST: 20 U/L (ref 0–37)
Albumin: 3.9 g/dL (ref 3.5–5.2)
Alkaline Phosphatase: 58 U/L (ref 39–117)
Bilirubin, Direct: 0 mg/dL (ref 0.0–0.3)
Total Bilirubin: 0.6 mg/dL (ref 0.2–1.2)
Total Protein: 7.2 g/dL (ref 6.0–8.3)

## 2014-04-10 LAB — BASIC METABOLIC PANEL
BUN: 8 mg/dL (ref 6–23)
CO2: 22 mEq/L (ref 19–32)
Calcium: 9.2 mg/dL (ref 8.4–10.5)
Chloride: 107 mEq/L (ref 96–112)
Creatinine, Ser: 0.9 mg/dL (ref 0.4–1.5)
GFR: 104.26 mL/min (ref >60.00)
GLUCOSE: 90 mg/dL (ref 70–99)
POTASSIUM: 4 meq/L (ref 3.5–5.1)
SODIUM: 138 meq/L (ref 135–145)

## 2014-04-10 LAB — CBC WITH DIFFERENTIAL/PLATELET
BASOS ABS: 0 10*3/uL (ref 0.0–0.1)
Basophils Relative: 0.5 % (ref 0.0–3.0)
EOS PCT: 1.5 % (ref 0.0–5.0)
Eosinophils Absolute: 0.1 10*3/uL (ref 0.0–0.7)
HEMATOCRIT: 44.9 % (ref 39.0–52.0)
HEMOGLOBIN: 15.1 g/dL (ref 13.0–17.0)
LYMPHS ABS: 2.3 10*3/uL (ref 0.7–4.0)
Lymphocytes Relative: 35.6 % (ref 12.0–46.0)
MCHC: 33.7 g/dL (ref 30.0–36.0)
MCV: 87 fl (ref 78.0–100.0)
MONOS PCT: 5.9 % (ref 3.0–12.0)
Monocytes Absolute: 0.4 10*3/uL (ref 0.1–1.0)
Neutro Abs: 3.7 10*3/uL (ref 1.4–7.7)
Neutrophils Relative %: 56.5 % (ref 43.0–77.0)
Platelets: 225 10*3/uL (ref 150.0–400.0)
RBC: 5.16 Mil/uL (ref 4.22–5.81)
RDW: 12.6 % (ref 11.5–15.5)
WBC: 6.5 10*3/uL (ref 4.0–10.5)

## 2014-04-10 LAB — POCT URINALYSIS DIPSTICK
Bilirubin, UA: NEGATIVE
Blood, UA: NEGATIVE
Glucose, UA: NEGATIVE
Ketones, UA: NEGATIVE
LEUKOCYTES UA: NEGATIVE
NITRITE UA: NEGATIVE
PROTEIN UA: NEGATIVE
Spec Grav, UA: 1.02
Urobilinogen, UA: 0.2
pH, UA: 6

## 2014-04-10 LAB — LIPID PANEL
CHOL/HDL RATIO: 4
Cholesterol: 233 mg/dL — ABNORMAL HIGH (ref 0–200)
HDL: 52 mg/dL (ref >39.00)
NonHDL: 181
Triglycerides: 354 mg/dL — ABNORMAL HIGH (ref 0.0–149.0)
VLDL: 70.8 mg/dL — AB (ref 0.0–40.0)

## 2014-04-10 LAB — TSH: TSH: 2.1 u[IU]/mL (ref 0.35–4.50)

## 2014-04-10 LAB — LDL CHOLESTEROL, DIRECT: Direct LDL: 157.3 mg/dL

## 2014-04-18 ENCOUNTER — Ambulatory Visit (INDEPENDENT_AMBULATORY_CARE_PROVIDER_SITE_OTHER): Payer: 59 | Admitting: Family Medicine

## 2014-04-18 ENCOUNTER — Encounter: Payer: Self-pay | Admitting: Family Medicine

## 2014-04-18 VITALS — BP 128/70 | HR 76 | Temp 98.1°F | Ht 70.0 in | Wt 220.0 lb

## 2014-04-18 DIAGNOSIS — Z0000 Encounter for general adult medical examination without abnormal findings: Secondary | ICD-10-CM

## 2014-04-18 NOTE — Progress Notes (Signed)
   Subjective:    Patient ID: Jose Barker, male    DOB: 11-Dec-1975, 38 y.o.   MRN: [id number]  HPI Patient here for complete physical. Generally very healthy. He has had history of some obesity but has actually lost about 5 pounds last year. No consistent exercise. Takes no regular medications. Tetanus up-to-date.  No past medical history on file. No past surgical history on file.  reports that he has never smoked. He does not have any smokeless tobacco history on file. He reports that he drinks alcohol. His drug history is not on file. family history includes Arthritis in his other; Cancer in his other; Cancer (age of onset: 6442) in his sister; Hypertension in his brother and father; Parkinsonism in his father. No Known Allergies    Review of Systems  Constitutional: Negative for fever, activity change, appetite change and fatigue.  HENT: Negative for congestion, ear pain and trouble swallowing.   Eyes: Negative for pain and visual disturbance.  Respiratory: Negative for cough, shortness of breath and wheezing.   Cardiovascular: Negative for chest pain and palpitations.  Gastrointestinal: Negative for nausea, vomiting, abdominal pain, diarrhea, constipation, blood in stool, abdominal distention and rectal pain.  Genitourinary: Negative for dysuria, hematuria and testicular pain.  Musculoskeletal: Negative for arthralgias and joint swelling.  Skin: Negative for rash.  Neurological: Negative for dizziness, syncope and headaches.  Hematological: Negative for adenopathy.  Psychiatric/Behavioral: Negative for confusion and dysphoric mood.       Objective:   Physical Exam  Constitutional: He is oriented to person, place, and time. He appears well-developed and well-nourished. No distress.  HENT:  Head: Normocephalic and atraumatic.  Right Ear: External ear normal.  Left Ear: External ear normal.  Mouth/Throat: Oropharynx is clear and moist.  Eyes: Conjunctivae and EOM are normal.  Pupils are equal, round, and reactive to light.  Neck: Normal range of motion. Neck supple. No thyromegaly present.  Cardiovascular: Normal rate, regular rhythm and normal heart sounds.   No murmur heard. Pulmonary/Chest: No respiratory distress. He has no wheezes. He has no rales.  Abdominal: Soft. Bowel sounds are normal. He exhibits no distension and no mass. There is no tenderness. There is no rebound and no guarding.  Musculoskeletal: He exhibits no edema.  Lymphadenopathy:    He has no cervical adenopathy.  Neurological: He is alert and oriented to person, place, and time. He displays normal reflexes. No cranial nerve deficit.  Skin: No rash noted.  Psychiatric: He has a normal mood and affect.          Assessment & Plan:  Complete physical. Patient has high triglycerides, otherwise normal labs. We have made suggestions try to lower her triglycerides. We have encouraged him to lose some weight and more consistent exercise.

## 2014-04-18 NOTE — Patient Instructions (Signed)

## 2014-04-18 NOTE — Progress Notes (Signed)
Pre visit review using our clinic review tool, if applicable. No additional management support is needed unless otherwise documented below in the visit note. 

## 2015-04-26 ENCOUNTER — Other Ambulatory Visit (INDEPENDENT_AMBULATORY_CARE_PROVIDER_SITE_OTHER): Payer: 59

## 2015-04-26 DIAGNOSIS — Z0000 Encounter for general adult medical examination without abnormal findings: Secondary | ICD-10-CM | POA: Diagnosis not present

## 2015-04-26 DIAGNOSIS — R7989 Other specified abnormal findings of blood chemistry: Secondary | ICD-10-CM

## 2015-04-26 LAB — CBC WITH DIFFERENTIAL/PLATELET
Basophils Absolute: 0 10*3/uL (ref 0.0–0.1)
Basophils Relative: 0.5 % (ref 0.0–3.0)
EOS ABS: 0.1 10*3/uL (ref 0.0–0.7)
Eosinophils Relative: 1.5 % (ref 0.0–5.0)
HCT: 44.6 % (ref 39.0–52.0)
Hemoglobin: 15.2 g/dL (ref 13.0–17.0)
LYMPHS PCT: 31.8 % (ref 12.0–46.0)
Lymphs Abs: 2.1 10*3/uL (ref 0.7–4.0)
MCHC: 34 g/dL (ref 30.0–36.0)
MCV: 86.6 fl (ref 78.0–100.0)
Monocytes Absolute: 0.4 10*3/uL (ref 0.1–1.0)
Monocytes Relative: 6.9 % (ref 3.0–12.0)
NEUTROS ABS: 3.9 10*3/uL (ref 1.4–7.7)
NEUTROS PCT: 59.3 % (ref 43.0–77.0)
PLATELETS: 237 10*3/uL (ref 150.0–400.0)
RBC: 5.15 Mil/uL (ref 4.22–5.81)
RDW: 12.4 % (ref 11.5–15.5)
WBC: 6.5 10*3/uL (ref 4.0–10.5)

## 2015-04-26 LAB — LDL CHOLESTEROL, DIRECT: Direct LDL: 166 mg/dL

## 2015-04-26 LAB — BASIC METABOLIC PANEL
BUN: 12 mg/dL (ref 6–23)
CALCIUM: 9.3 mg/dL (ref 8.4–10.5)
CO2: 24 meq/L (ref 19–32)
CREATININE: 0.89 mg/dL (ref 0.40–1.50)
Chloride: 106 mEq/L (ref 96–112)
GFR: 101.01 mL/min (ref >60.00)
Glucose, Bld: 99 mg/dL (ref 70–99)
Potassium: 3.9 mEq/L (ref 3.5–5.1)
Sodium: 140 mEq/L (ref 135–145)

## 2015-04-26 LAB — HEPATIC FUNCTION PANEL
ALK PHOS: 56 U/L (ref 39–117)
ALT: 23 U/L (ref 0–53)
AST: 19 U/L (ref 0–37)
Albumin: 4.2 g/dL (ref 3.5–5.2)
Bilirubin, Direct: 0.1 mg/dL (ref 0.0–0.3)
Total Bilirubin: 0.6 mg/dL (ref 0.2–1.2)
Total Protein: 7.4 g/dL (ref 6.0–8.3)

## 2015-04-26 LAB — LIPID PANEL
Cholesterol: 261 mg/dL — ABNORMAL HIGH (ref 0–200)
HDL: 62.4 mg/dL (ref >39.00)
NONHDL: 198.43
TRIGLYCERIDES: 219 mg/dL — AB (ref 0.0–149.0)
Total CHOL/HDL Ratio: 4
VLDL: 43.8 mg/dL — ABNORMAL HIGH (ref 0.0–40.0)

## 2015-04-26 LAB — TSH: TSH: 1.82 u[IU]/mL (ref 0.35–4.50)

## 2015-05-08 ENCOUNTER — Encounter: Payer: Self-pay | Admitting: Family Medicine

## 2015-05-08 ENCOUNTER — Ambulatory Visit (INDEPENDENT_AMBULATORY_CARE_PROVIDER_SITE_OTHER): Payer: 59 | Admitting: Family Medicine

## 2015-05-08 VITALS — BP 126/80 | HR 78 | Temp 98.4°F | Ht 70.0 in | Wt 221.0 lb

## 2015-05-08 DIAGNOSIS — Z0000 Encounter for general adult medical examination without abnormal findings: Secondary | ICD-10-CM | POA: Diagnosis not present

## 2015-05-08 NOTE — Patient Instructions (Signed)

## 2015-05-08 NOTE — Progress Notes (Signed)
   Subjective:    Patient ID: Jose Barker, male    DOB: 02-Feb-1976, 39 y.o.   MRN: [id number]  HPI Patient here for complete physical. He has no chronic medical problems. Takes no medications. Nonsmoker. Tetanus up-to-date. He plans to get flu vaccine later this year through his work. Family history reviewed with no changes.  He does complain of some general lethargy. Generally gets about 7 hours sleep per night. He thinks some of this may be stress related to work. He works very long hours and frequently has to do several hours of work at home each day. He's not getting any consistent exercise. Denies any depressive symptoms. No reported observed apnea. No daytime somnolence.  No past medical history on file. No past surgical history on file.  reports that he has never smoked. He does not have any smokeless tobacco history on file. He reports that he drinks alcohol. His drug history is not on file. family history includes Arthritis in his other; Cancer in his other; Cancer (age of onset: 32) in his sister; Hypertension in his brother and father; Parkinsonism in his father. No Known Allergies    Review of Systems  Constitutional: Positive for fatigue. Negative for fever, activity change, appetite change and unexpected weight change.  HENT: Negative for congestion, ear pain and trouble swallowing.   Eyes: Negative for pain and visual disturbance.  Respiratory: Negative for cough, shortness of breath and wheezing.   Cardiovascular: Negative for chest pain and palpitations.  Gastrointestinal: Negative for nausea, vomiting, abdominal pain, diarrhea, constipation, blood in stool, abdominal distention and rectal pain.  Endocrine: Negative for polydipsia and polyuria.  Genitourinary: Negative for dysuria, hematuria and testicular pain.  Musculoskeletal: Negative for joint swelling and arthralgias.  Skin: Negative for rash.  Neurological: Negative for dizziness, syncope and headaches.    Hematological: Negative for adenopathy.  Psychiatric/Behavioral: Negative for confusion and dysphoric mood.       Objective:   Physical Exam  Constitutional: He is oriented to person, place, and time. He appears well-developed and well-nourished. No distress.  HENT:  Head: Normocephalic and atraumatic.  Right Ear: External ear normal.  Left Ear: External ear normal.  Mouth/Throat: Oropharynx is clear and moist.  Eyes: Conjunctivae and EOM are normal. Pupils are equal, round, and reactive to light.  Neck: Normal range of motion. Neck supple. No thyromegaly present.  Cardiovascular: Normal rate, regular rhythm and normal heart sounds.   No murmur heard. Pulmonary/Chest: No respiratory distress. He has no wheezes. He has no rales.  Abdominal: Soft. Bowel sounds are normal. He exhibits no distension and no mass. There is no tenderness. There is no rebound and no guarding.  Musculoskeletal: He exhibits no edema.  Lymphadenopathy:    He has no cervical adenopathy.  Neurological: He is alert and oriented to person, place, and time. He displays normal reflexes. No cranial nerve deficit.  Skin: No rash noted.  Psychiatric: He has a normal mood and affect.          Assessment & Plan:  Complete physical. He has dyslipidemia. We've recommended weight loss and establish more consistent exercise. Offered flu vaccine and he declines

## 2015-05-08 NOTE — Progress Notes (Signed)
Pre visit review using our clinic review tool, if applicable. No additional management support is needed unless otherwise documented below in the visit note. 

## 2016-05-12 ENCOUNTER — Encounter: Payer: 59 | Admitting: Family Medicine

## 2016-05-14 ENCOUNTER — Other Ambulatory Visit (INDEPENDENT_AMBULATORY_CARE_PROVIDER_SITE_OTHER): Payer: 59

## 2016-05-14 DIAGNOSIS — Z0000 Encounter for general adult medical examination without abnormal findings: Secondary | ICD-10-CM | POA: Diagnosis not present

## 2016-05-14 LAB — CBC WITH DIFFERENTIAL/PLATELET
BASOS PCT: 0.6 % (ref 0.0–3.0)
Basophils Absolute: 0 10*3/uL (ref 0.0–0.1)
EOS PCT: 1.3 % (ref 0.0–5.0)
Eosinophils Absolute: 0.1 10*3/uL (ref 0.0–0.7)
HEMATOCRIT: 43.5 % (ref 39.0–52.0)
Hemoglobin: 14.9 g/dL (ref 13.0–17.0)
LYMPHS PCT: 30.6 % (ref 12.0–46.0)
Lymphs Abs: 1.7 10*3/uL (ref 0.7–4.0)
MCHC: 34.3 g/dL (ref 30.0–36.0)
MCV: 86.4 fl (ref 78.0–100.0)
MONO ABS: 0.4 10*3/uL (ref 0.1–1.0)
Monocytes Relative: 6.6 % (ref 3.0–12.0)
NEUTROS ABS: 3.4 10*3/uL (ref 1.4–7.7)
Neutrophils Relative %: 60.9 % (ref 43.0–77.0)
PLATELETS: 204 10*3/uL (ref 150.0–400.0)
RBC: 5.03 Mil/uL (ref 4.22–5.81)
RDW: 12.2 % (ref 11.5–15.5)
WBC: 5.7 10*3/uL (ref 4.0–10.5)

## 2016-05-14 LAB — LIPID PANEL
CHOLESTEROL: 218 mg/dL — AB (ref 0–200)
HDL: 57.4 mg/dL (ref >39.00)
LDL Cholesterol: 137 mg/dL — ABNORMAL HIGH (ref 0–99)
NonHDL: 160.19
Total CHOL/HDL Ratio: 4
Triglycerides: 117 mg/dL (ref 0.0–149.0)
VLDL: 23.4 mg/dL (ref 0.0–40.0)

## 2016-05-14 LAB — BASIC METABOLIC PANEL
BUN: 12 mg/dL (ref 6–23)
CO2: 25 meq/L (ref 19–32)
Calcium: 9 mg/dL (ref 8.4–10.5)
Chloride: 108 mEq/L (ref 96–112)
Creatinine, Ser: 0.88 mg/dL (ref 0.40–1.50)
GFR: 101.78 mL/min (ref >60.00)
Glucose, Bld: 87 mg/dL (ref 70–99)
POTASSIUM: 4.2 meq/L (ref 3.5–5.1)
Sodium: 140 mEq/L (ref 135–145)

## 2016-05-14 LAB — HEPATIC FUNCTION PANEL
ALBUMIN: 4.1 g/dL (ref 3.5–5.2)
ALT: 21 U/L (ref 0–53)
AST: 17 U/L (ref 0–37)
Alkaline Phosphatase: 60 U/L (ref 39–117)
Bilirubin, Direct: 0.1 mg/dL (ref 0.0–0.3)
TOTAL PROTEIN: 6.9 g/dL (ref 6.0–8.3)
Total Bilirubin: 0.8 mg/dL (ref 0.2–1.2)

## 2016-05-14 LAB — TSH: TSH: 1.47 u[IU]/mL (ref 0.35–4.50)

## 2016-05-22 ENCOUNTER — Encounter: Payer: Self-pay | Admitting: Family Medicine

## 2016-05-22 ENCOUNTER — Ambulatory Visit (INDEPENDENT_AMBULATORY_CARE_PROVIDER_SITE_OTHER): Payer: 59 | Admitting: Family Medicine

## 2016-05-22 VITALS — BP 110/76 | HR 100 | Temp 98.2°F | Ht 70.0 in | Wt 217.0 lb

## 2016-05-22 DIAGNOSIS — Z0000 Encounter for general adult medical examination without abnormal findings: Secondary | ICD-10-CM | POA: Diagnosis not present

## 2016-05-22 DIAGNOSIS — Z23 Encounter for immunization: Secondary | ICD-10-CM

## 2016-05-22 NOTE — Progress Notes (Signed)
Subjective:     Patient ID: Jose Barker, male   DOB: 05-16-1976, 40 y.o.   MRN: [id number]  HPI Patient seen for physical. He has no chronic medical problems takes no regular medications. His father died of complications of possible Parkinson's disease. Mother died of complications following head injury. He's made some dietary changes during the past year with reducing sugars and starches and reduced portion control. He lost about 4 pounds. He had lost even more weight but has recently gained some back. Needs flu vaccine. Tetanus up-to-date. Denies any chest pains or other complaints  History reviewed. No pertinent past medical history. History reviewed. No pertinent surgical history.  reports that he has never smoked. He has never used smokeless tobacco. He reports that he drinks alcohol. His drug history is not on file. family history includes Arthritis in his other; Cancer in his other; Cancer (age of onset: 1842) in his sister; Hypertension in his brother and father; Parkinsonism in his father. No Known Allergies   Review of Systems  Constitutional: Negative for activity change, appetite change, fatigue and fever.  HENT: Negative for congestion, ear pain and trouble swallowing.   Eyes: Negative for pain and visual disturbance.  Respiratory: Negative for cough, shortness of breath and wheezing.   Cardiovascular: Negative for chest pain and palpitations.  Gastrointestinal: Negative for abdominal distention, abdominal pain, blood in stool, constipation, diarrhea, nausea, rectal pain and vomiting.  Endocrine: Negative for polydipsia and polyuria.  Genitourinary: Negative for dysuria, hematuria and testicular pain.  Musculoskeletal: Negative for arthralgias and joint swelling.  Skin: Negative for rash.  Neurological: Negative for dizziness, syncope and headaches.  Hematological: Negative for adenopathy.  Psychiatric/Behavioral: Negative for confusion and dysphoric mood.       Objective:   Physical Exam  Constitutional: He is oriented to person, place, and time. He appears well-developed and well-nourished. No distress.  HENT:  Head: Normocephalic and atraumatic.  Right Ear: External ear normal.  Left Ear: External ear normal.  Mouth/Throat: Oropharynx is clear and moist.  Eyes: Conjunctivae and EOM are normal. Pupils are equal, round, and reactive to light.  Neck: Normal range of motion. Neck supple. No thyromegaly present.  Cardiovascular: Normal rate, regular rhythm and normal heart sounds.   No murmur heard. Pulmonary/Chest: No respiratory distress. He has no wheezes. He has no rales.  Abdominal: Soft. Bowel sounds are normal. He exhibits no distension and no mass. There is no tenderness. There is no rebound and no guarding.  Musculoskeletal: He exhibits no edema.  Lymphadenopathy:    He has no cervical adenopathy.  Neurological: He is alert and oriented to person, place, and time. He displays normal reflexes. No cranial nerve deficit.  Skin: No rash noted.  Psychiatric: He has a normal mood and affect.       Assessment:     Physical exam. Labs reviewed with patient with no major concerns. His lipids have improved    Plan:     -Flu vaccine given -Continue regular exercise habits. -continue to watch sugar and starch intake.  Kristian CoveyBruce W Cameshia Cressman MD [facility]

## 2016-05-22 NOTE — Progress Notes (Signed)
Pre visit review using our clinic review tool, if applicable. No additional management support is needed unless otherwise documented below in the visit note. 

## 2016-05-25 DIAGNOSIS — Z23 Encounter for immunization: Secondary | ICD-10-CM

## 2017-01-18 ENCOUNTER — Telehealth: Payer: Self-pay | Admitting: Family Medicine

## 2017-01-18 MED ORDER — SCOPOLAMINE 1 MG/3DAYS TD PT72
MEDICATED_PATCH | TRANSDERMAL | 0 refills | Status: DC
Start: 2017-01-18 — End: 2017-10-19

## 2017-01-18 NOTE — Telephone Encounter (Signed)
Rx sent 

## 2017-01-18 NOTE — Telephone Encounter (Signed)
Scopolamine patch one patch behind ear every 72 hours as needed-disp #5 patches

## 2017-01-18 NOTE — Telephone Encounter (Signed)
° ° °  Pt is taking a cruise and is asking for the sea sick patch for 8 days  [phone]    Pharmacy [facility] [address]

## 2017-05-20 ENCOUNTER — Encounter: Payer: Self-pay | Admitting: Family Medicine

## 2017-05-25 ENCOUNTER — Ambulatory Visit (INDEPENDENT_AMBULATORY_CARE_PROVIDER_SITE_OTHER): Payer: 59 | Admitting: Family Medicine

## 2017-05-25 ENCOUNTER — Encounter: Payer: Self-pay | Admitting: Family Medicine

## 2017-05-25 VITALS — BP 110/70 | HR 69 | Temp 98.7°F | Ht 69.75 in | Wt 206.1 lb

## 2017-05-25 DIAGNOSIS — Z0001 Encounter for general adult medical examination with abnormal findings: Secondary | ICD-10-CM

## 2017-05-25 DIAGNOSIS — H109 Unspecified conjunctivitis: Secondary | ICD-10-CM

## 2017-05-25 DIAGNOSIS — Z0000 Encounter for general adult medical examination without abnormal findings: Secondary | ICD-10-CM

## 2017-05-25 LAB — CBC WITH DIFFERENTIAL/PLATELET
BASOS PCT: 0.5 % (ref 0.0–3.0)
Basophils Absolute: 0 10*3/uL (ref 0.0–0.1)
EOS PCT: 1 % (ref 0.0–5.0)
Eosinophils Absolute: 0.1 10*3/uL (ref 0.0–0.7)
HEMATOCRIT: 45.4 % (ref 39.0–52.0)
HEMOGLOBIN: 15.4 g/dL (ref 13.0–17.0)
Lymphocytes Relative: 19.2 % (ref 12.0–46.0)
Lymphs Abs: 1.7 10*3/uL (ref 0.7–4.0)
MCHC: 33.9 g/dL (ref 30.0–36.0)
MCV: 89.7 fl (ref 78.0–100.0)
MONOS PCT: 7.8 % (ref 3.0–12.0)
Monocytes Absolute: 0.7 10*3/uL (ref 0.1–1.0)
Neutro Abs: 6.5 10*3/uL (ref 1.4–7.7)
Neutrophils Relative %: 71.5 % (ref 43.0–77.0)
Platelets: 204 10*3/uL (ref 150.0–400.0)
RBC: 5.07 Mil/uL (ref 4.22–5.81)
RDW: 12.6 % (ref 11.5–15.5)
WBC: 9.1 10*3/uL (ref 4.0–10.5)

## 2017-05-25 LAB — LIPID PANEL
CHOLESTEROL: 219 mg/dL — AB (ref 0–200)
HDL: 71.1 mg/dL (ref >39.00)
LDL Cholesterol: 124 mg/dL — ABNORMAL HIGH (ref 0–99)
NONHDL: 148.28
Total CHOL/HDL Ratio: 3
Triglycerides: 123 mg/dL (ref 0.0–149.0)
VLDL: 24.6 mg/dL (ref 0.0–40.0)

## 2017-05-25 LAB — BASIC METABOLIC PANEL
BUN: 14 mg/dL (ref 6–23)
CALCIUM: 9.5 mg/dL (ref 8.4–10.5)
CO2: 27 mEq/L (ref 19–32)
Chloride: 104 mEq/L (ref 96–112)
Creatinine, Ser: 0.83 mg/dL (ref 0.40–1.50)
GFR: 108.34 mL/min (ref >60.00)
GLUCOSE: 94 mg/dL (ref 70–99)
POTASSIUM: 4.1 meq/L (ref 3.5–5.1)
Sodium: 139 mEq/L (ref 135–145)

## 2017-05-25 LAB — HEPATIC FUNCTION PANEL
ALK PHOS: 66 U/L (ref 39–117)
ALT: 18 U/L (ref 0–53)
AST: 21 U/L (ref 0–37)
Albumin: 4.4 g/dL (ref 3.5–5.2)
Bilirubin, Direct: 0.1 mg/dL (ref 0.0–0.3)
TOTAL PROTEIN: 7.5 g/dL (ref 6.0–8.3)
Total Bilirubin: 0.7 mg/dL (ref 0.2–1.2)

## 2017-05-25 LAB — TSH: TSH: 2.42 u[IU]/mL (ref 0.35–4.50)

## 2017-05-25 MED ORDER — POLYMYXIN B-TRIMETHOPRIM 10000-0.1 UNIT/ML-% OP SOLN: 2.0000 [drp] | OPHTHALMIC | 0 refills | Status: DC

## 2017-05-25 NOTE — Patient Instructions (Signed)

## 2017-05-25 NOTE — Progress Notes (Signed)
Subjective:     Patient ID: Jose Barker, male   DOB: [birth year], 41 y.o.   MRN: [id number]  HPI Patient here for complete physical. He has acute problem of right eye irritation for the past couple days. No injury. No blurred vision. He has some itching. This morning woke up with some crusted yellowish drainage. No history of contact use.  Tetanus up-to-date. Takes no prescription medications. He's done excellent job this past year with weight loss. has done this through portion reduction. Exercises several days per week. Nonsmoker. No regular alcohol use.  No past medical history on file. No past surgical history on file.  reports that he has never smoked. He has never used smokeless tobacco. He reports that he drinks alcohol. His drug history is not on file. family history includes Arthritis in his other; Cancer in his other; Cancer (age of onset: 28) in his sister; Hypertension in his brother and father; Parkinsonism in his father. No Known Allergies   Review of Systems  Constitutional: Negative for activity change, appetite change, fatigue and fever.  HENT: Negative for congestion, ear pain and trouble swallowing.   Eyes: Positive for discharge, redness and itching. Negative for pain and visual disturbance.  Respiratory: Negative for cough, shortness of breath and wheezing.   Cardiovascular: Negative for chest pain and palpitations.  Gastrointestinal: Negative for abdominal distention, abdominal pain, blood in stool, constipation, diarrhea, nausea, rectal pain and vomiting.  Endocrine: Negative for polydipsia and polyuria.  Genitourinary: Negative for dysuria, hematuria and testicular pain.  Musculoskeletal: Negative for arthralgias and joint swelling.  Skin: Negative for rash.  Neurological: Negative for dizziness, syncope and headaches.  Hematological: Negative for adenopathy.  Psychiatric/Behavioral: Negative for confusion and dysphoric mood.       Objective:   Physical Exam   Constitutional: He is oriented to person, place, and time. He appears well-developed and well-nourished. No distress.  HENT:  Head: Normocephalic and atraumatic.  Right Ear: External ear normal.  Left Ear: External ear normal.  Mouth/Throat: Oropharynx is clear and moist.  Eyes: Pupils are equal, round, and reactive to light. EOM are normal.  Right conjunctiva is erythematous. Left is normal. Pupils equal round and reactive to light. Cornea appears normal. No visible foreign bodies.  Neck: Normal range of motion. Neck supple. No thyromegaly present.  Cardiovascular: Normal rate, regular rhythm and normal heart sounds.   No murmur heard. Pulmonary/Chest: No respiratory distress. He has no wheezes. He has no rales.  Abdominal: Soft. Bowel sounds are normal. He exhibits no distension and no mass. There is no tenderness. There is no rebound and no guarding.  Musculoskeletal: He exhibits no edema.  Lymphadenopathy:    He has no cervical adenopathy.  Neurological: He is alert and oriented to person, place, and time. He displays normal reflexes. No cranial nerve deficit.  Skin: No rash noted.  Psychiatric: He has a normal mood and affect.       Assessment:     Physical exam. Patient still needs flu vaccine but wishes to defer because of viral URI symptoms. He has probable bacterial conjunctivitis right eye.  has done excellent job with weight loss this year due to dietary modifications    Plan:     -Obtain screening labs -He will defer flu vaccine until next month -Start Polytrim ophthalmic drops right eye every 2-4 hours while awake and touch base in 3 days if symptoms not resolving -Continue weight control efforts  Kristian Covey MD [facility] at [facility]

## 2017-08-15 DIAGNOSIS — Z23 Encounter for immunization: Secondary | ICD-10-CM | POA: Diagnosis not present

## 2017-10-19 ENCOUNTER — Ambulatory Visit (INDEPENDENT_AMBULATORY_CARE_PROVIDER_SITE_OTHER): Payer: 59 | Admitting: Family Medicine

## 2017-10-19 ENCOUNTER — Encounter: Payer: Self-pay | Admitting: Family Medicine

## 2017-10-19 VITALS — BP 102/63 | HR 64 | Temp 98.7°F | Wt 212.4 lb

## 2017-10-19 DIAGNOSIS — R55 Syncope and collapse: Secondary | ICD-10-CM

## 2017-10-19 NOTE — Progress Notes (Signed)
Subjective:     Patient ID: Jose Barker, male   DOB: [birth year], 42 y.o.   MRN: [id number]  HPI Patient seen following episode of loss of consciousness which occurred around 3 AM after he went to urinate. He had very similar episode back in 2014-while urinating. He also recalls several years ago he observed his mother-in-law getting an IV placed in the hospital and had brief syncopal episode.  He felt fine when he first went to the bathroom and right after urinating felt very dizzy. He was starting to go back toward bed when he lost consciousness he thinks for just seconds. His wife was there with him. He was not confused afterwards. No head injury. Has felt well since then. Hydrating well.  Tends to have low blood pressure at baseline. Does not describe consistent or regular orthostatic type symptoms. No recent chest pains. No headaches. He had labs back in September with physical with no anemia or other concerns. No history of seizures  No past medical history on file. No past surgical history on file.  reports that  has never smoked. he has never used smokeless tobacco. He reports that he drinks alcohol. His drug history is not on file. family history includes Arthritis in his other; Cancer in his other; Cancer (age of onset: 3642) in his sister; Hypertension in his brother and father; Parkinsonism in his father. No Known Allergies   Review of Systems  Constitutional: Negative for chills, fatigue and fever.  Eyes: Negative for visual disturbance.  Respiratory: Negative for cough, chest tightness and shortness of breath.   Cardiovascular: Negative for chest pain, palpitations and leg swelling.  Genitourinary: Negative for dysuria.  Neurological: Positive for syncope. Negative for dizziness, seizures, speech difficulty, weakness, light-headedness and headaches.       Objective:   Physical Exam  Constitutional: He is oriented to person, place, and time. He appears well-developed and  well-nourished.  HENT:  Head: Normocephalic and atraumatic.  Neck: Neck supple.  Cardiovascular: Normal rate and regular rhythm. Exam reveals no gallop.  No murmur heard. Pulmonary/Chest: Effort normal and breath sounds normal. No respiratory distress. He has no wheezes. He has no rales.  Neurological: He is alert and oriented to person, place, and time. No cranial nerve deficit. Coordination normal.       Assessment:     Recent episode of syncope while urinating. His history highly suggests vasovagal or micturition syncope.  No suggestion for likely seizure or cardiac etiology    Plan:     -Check EKG-normal sinus rhythm with no acute changes -Will discussed scenarios where this type of syncope is likely to occur for example with urination, bowel movements, or emotional stimuli such as blood draws or pain -We discussed the importance of safety if he feels symptoms coming on with getting low to the ground to avoid head injury  Kristian CoveyBruce W Savvas Roper MD [facility]

## 2017-10-19 NOTE — Patient Instructions (Signed)
Vasovagal Syncope, Adult  Syncope, which is commonly known as fainting or passing out, is a temporary loss of consciousness. It occurs when the blood flow to the brain is reduced. Vasovagal syncope, also called neurocardiogenic syncope, is a fainting spell that happens when blood flow to the brain is reduced because of a sudden drop in heart rate and blood pressure.  Vasovagal syncope is usually harmless. However, you can get injured if you fall during a fainting spell.  What are the causes?  This condition is caused by a drop in heart rate and blood pressure, usually in response to a trigger. Many things and situations can trigger an episode, including:  · Pain.  · Fear.  · The sight of blood. This may occur during medical procedures, such as when blood is being drawn from a vein.  · Common activities, such as coughing, swallowing, stretching, or going to the bathroom.  · Emotional stress.  · Being in a confined space.  · Prolonged standing, especially in a warm environment.  · Lack of sleep or rest.  · Not eating for a long time.  · Not drinking enough liquids.  · Recent illness.  · Drinking alcohol.  · Taking drugs that affect blood pressure, such as marijuana, cocaine, opiates, or inhalants.    What are the signs or symptoms?  Before a fainting episode, you may:  · Feel dizzy or light-headed.  · Become pale.  · Sense that you are going to faint.  · Feel like the room is spinning.  · Only see directly ahead (tunnel vision).  · Feel sick to your stomach (nauseous).  · See spots.  · Slowly lose vision.  · Hear ringing in your ears.  · Have a headache.  · Feel warm and sweaty.  · Feel a sensation of pins and needles.    During the fainting spell, you may twitch or make jerky movements. Fainting spells usually last no longer than a few minutes before you wake up. If you get up too quickly before your body can recover, you may faint again.  How is this diagnosed?  This condition is diagnosed based on your symptoms,  your medical history, and a physical exam. Tests may be done to rule out other causes of fainting. Tests may include:  · Blood tests.  · Heart tests, such as an electrocardiogram (ECG), echocardiogram, or electrophysiology study.  · A test to check your response to changes in position (tilt table test).    How is this treated?  Usually, treatment is not needed for this condition. Your health care provider may suggest ways to help prevent fainting episodes. These may include:  · Drinking additional fluids if you are exposed to a trigger.  · Sitting or lying down if you notice signs that an episode is coming.    If your fainting spells continue, your health care provider may recommend that you:  · Take medicines to prevent fainting or to help reduce further episodes of fainting.  · Do certain exercises.  · Wear compression stockings.  · Have surgery to place a pacemaker in your body (rare).    Follow these instructions at home:  · Learn to identify the signs that an episode is coming.  · Sit or lie down at the first sign of a fainting spell. If you sit down, put your head down between your legs. If you lie down, swing your legs up in the air to increase blood flow to the brain.  ·   Avoid hot tubs and saunas.  · Avoid standing for a long time. If you have to stand for a long time, try:  ? Crossing your legs.  ? Flexing and stretching your leg muscles.  ? Squatting.  ? Moving your legs.  ? Bending over.  · Drink enough fluid to keep your urine clear or pale yellow.  · Make changes to your diet that your health care provider recommends. You may be told to:  ? Avoid caffeine.  ? Eat more salt.  · Take over-the-counter and prescription medicines only as told by your health care provider.  Contact a health care provider if:  · You continue to have fainting spells despite treatment.  · You faint more often despite treatment.  · You lose consciousness for more than a few minutes.  · You faint during or after exercising or  after being startled.  · You have twitching or jerky movements for longer than a few seconds during a fainting spell.  · You have an episode of twitching or jerky movements without fainting.  Get help right away if:  · A fainting spell leads to an injury or bleeding.  · You have new symptoms that occur with the fainting spells, such as:  ? Shortness of breath.  ? Chest pain.  ? Irregular heartbeat.  · You twitch or make jerky movements for more than 5 minutes.  · You twitch or make jerky movements during more than one fainting spell.  This information is not intended to replace advice given to you by your health care provider. Make sure you discuss any questions you have with your health care provider.  Document Released: 08/03/2012 Document Revised: 01/29/2016 Document Reviewed: 06/15/2015  Elsevier Interactive Patient Education © 2018 [employer]

## 2018-06-29 DIAGNOSIS — Z23 Encounter for immunization: Secondary | ICD-10-CM | POA: Diagnosis not present

## 2019-10-14 ENCOUNTER — Ambulatory Visit: Payer: 59 | Attending: Internal Medicine

## 2019-10-14 DIAGNOSIS — Z23 Encounter for immunization: Secondary | ICD-10-CM | POA: Insufficient documentation

## 2019-10-14 NOTE — Progress Notes (Signed)
   Covid-19 Vaccination Clinic  Name:  Jose Barker    MRN: [id number] DOB: 1976-01-04  10/14/2019  [name] was observed post Covid-19 immunization for 15 minutes without incidence. He was provided with Vaccine Information Sheet and instruction to access the V-Safe system.   [name] was instructed to call 911 with any severe reactions post vaccine: Marland Kitchen Difficulty breathing  . Swelling of your face and throat  . A fast heartbeat  . A bad rash all over your body  . Dizziness and weakness    Immunizations Administered    Name Date Dose VIS Date Route   Pfizer COVID-19 Vaccine 10/14/2019  2:23 PM 0.3 mL 08/11/2019 Intramuscular   Manufacturer: [employer], [employer]   Lot: [id number]   NDC: [address]-8

## 2019-11-06 ENCOUNTER — Ambulatory Visit: Payer: 59 | Attending: Internal Medicine

## 2019-11-06 DIAGNOSIS — Z23 Encounter for immunization: Secondary | ICD-10-CM | POA: Insufficient documentation

## 2019-11-06 NOTE — Progress Notes (Signed)
   Covid-19 Vaccination Clinic  Name:  Jose Barker    MRN: [id number] DOB: 1976/02/17  11/06/2019  Mr. Chelf was observed post Covid-19 immunization for 15 minutes without incident. He was provided with Vaccine Information Sheet and instruction to access the V-Safe system.   Mr. Kassa was instructed to call 911 with any severe reactions post vaccine: Marland Kitchen Difficulty breathing  . Swelling of face and throat  . A fast heartbeat  . A bad rash all over body  . Dizziness and weakness   Immunizations Administered    Name Date Dose VIS Date Route   Pfizer COVID-19 Vaccine 11/06/2019  6:42 PM 0.3 mL 08/11/2019 Intramuscular   Manufacturer: [employer], [employer]   Lot: [id number]   NDC: [address]-3

## 2019-12-13 ENCOUNTER — Ambulatory Visit (INDEPENDENT_AMBULATORY_CARE_PROVIDER_SITE_OTHER): Payer: 59 | Admitting: Family Medicine

## 2019-12-13 ENCOUNTER — Other Ambulatory Visit: Payer: Self-pay

## 2019-12-13 ENCOUNTER — Encounter: Payer: Self-pay | Admitting: Family Medicine

## 2019-12-13 VITALS — BP 112/68 | HR 74 | Temp 97.7°F | Wt 213.3 lb

## 2019-12-13 DIAGNOSIS — Z0000 Encounter for general adult medical examination without abnormal findings: Secondary | ICD-10-CM | POA: Diagnosis not present

## 2019-12-13 LAB — HEPATIC FUNCTION PANEL
ALT: 16 U/L (ref 0–53)
AST: 16 U/L (ref 0–37)
Albumin: 4.4 g/dL (ref 3.5–5.2)
Alkaline Phosphatase: 65 U/L (ref 39–117)
Bilirubin, Direct: 0.1 mg/dL (ref 0.0–0.3)
Total Bilirubin: 0.7 mg/dL (ref 0.2–1.2)
Total Protein: 7.1 g/dL (ref 6.0–8.3)

## 2019-12-13 LAB — CBC WITH DIFFERENTIAL/PLATELET
Basophils Absolute: 0.1 10*3/uL (ref 0.0–0.1)
Basophils Relative: 0.9 % (ref 0.0–3.0)
Eosinophils Absolute: 0.1 10*3/uL (ref 0.0–0.7)
Eosinophils Relative: 0.9 % (ref 0.0–5.0)
HCT: 44.6 % (ref 39.0–52.0)
Hemoglobin: 15.1 g/dL (ref 13.0–17.0)
Lymphocytes Relative: 30.8 % (ref 12.0–46.0)
Lymphs Abs: 1.8 10*3/uL (ref 0.7–4.0)
MCHC: 33.8 g/dL (ref 30.0–36.0)
MCV: 88.5 fl (ref 78.0–100.0)
Monocytes Absolute: 0.4 10*3/uL (ref 0.1–1.0)
Monocytes Relative: 6.4 % (ref 3.0–12.0)
Neutro Abs: 3.7 10*3/uL (ref 1.4–7.7)
Neutrophils Relative %: 61 % (ref 43.0–77.0)
Platelets: 219 10*3/uL (ref 150.0–400.0)
RBC: 5.04 Mil/uL (ref 4.22–5.81)
RDW: 12.4 % (ref 11.5–15.5)
WBC: 6 10*3/uL (ref 4.0–10.5)

## 2019-12-13 LAB — BASIC METABOLIC PANEL
BUN: 13 mg/dL (ref 6–23)
CO2: 25 mEq/L (ref 19–32)
Calcium: 9.1 mg/dL (ref 8.4–10.5)
Chloride: 102 mEq/L (ref 96–112)
Creatinine, Ser: 0.98 mg/dL (ref 0.40–1.50)
GFR: 83.13 mL/min (ref >60.00)
Glucose, Bld: 89 mg/dL (ref 70–99)
Potassium: 4.2 mEq/L (ref 3.5–5.1)
Sodium: 135 mEq/L (ref 135–145)

## 2019-12-13 LAB — LIPID PANEL
Cholesterol: 247 mg/dL — ABNORMAL HIGH (ref 0–200)
HDL: 62.1 mg/dL (ref >39.00)
LDL Cholesterol: 158 mg/dL — ABNORMAL HIGH (ref 0–99)
NonHDL: 185.05
Total CHOL/HDL Ratio: 4
Triglycerides: 133 mg/dL (ref 0.0–149.0)
VLDL: 26.6 mg/dL (ref 0.0–40.0)

## 2019-12-13 LAB — VITAMIN D 25 HYDROXY (VIT D DEFICIENCY, FRACTURES): VITD: 17.12 ng/mL — ABNORMAL LOW (ref 30.00–100.00)

## 2019-12-13 LAB — TSH: TSH: 1.96 u[IU]/mL (ref 0.35–4.50)

## 2019-12-13 NOTE — Patient Instructions (Signed)
Food Choices for Gastroesophageal Reflux Disease, Adult When you have gastroesophageal reflux disease (GERD), the foods you eat and your eating habits are very important. Choosing the right foods can help ease the discomfort of GERD. Consider working with a diet and nutrition specialist (dietitian) to help you make healthy food choices. What general guidelines should I follow?  Eating plan  Choose healthy foods low in fat, such as fruits, vegetables, whole grains, low-fat dairy products, and lean meat, fish, and poultry.  Eat frequent, small meals instead of three large meals each day. Eat your meals slowly, in a relaxed setting. Avoid bending over or lying down until 2-3 hours after eating.  Limit high-fat foods such as fatty meats or fried foods.  Limit your intake of oils, butter, and shortening to less than 8 teaspoons each day.  Avoid the following: ? Foods that cause symptoms. These may be different for different people. Keep a food diary to keep track of foods that cause symptoms. ? Alcohol. ? Drinking large amounts of liquid with meals. ? Eating meals during the 2-3 hours before bed.  Cook foods using methods other than frying. This may include baking, grilling, or broiling. Lifestyle  Maintain a healthy weight. Ask your health care provider what weight is healthy for you. If you need to lose weight, work with your health care provider to do so safely.  Exercise for at least 30 minutes on 5 or more days each week, or as told by your health care provider.  Avoid wearing clothes that fit tightly around your waist and chest.  Do not use any products that contain nicotine or tobacco, such as cigarettes and e-cigarettes. If you need help quitting, ask your health care provider.  Sleep with the head of your bed raised. Use a wedge under the mattress or blocks under the bed frame to raise the head of the bed. What foods are not recommended? The items listed may not be a complete  list. Talk with your dietitian about what dietary choices are best for you. Grains Pastries or quick breads with added fat. French toast. Vegetables Deep fried vegetables. French fries. Any vegetables prepared with added fat. Any vegetables that cause symptoms. For some people this may include tomatoes and tomato products, chili peppers, onions and garlic, and horseradish. Fruits Any fruits prepared with added fat. Any fruits that cause symptoms. For some people this may include citrus fruits, such as oranges, grapefruit, pineapple, and lemons. Meats and other protein foods High-fat meats, such as fatty beef or pork, hot dogs, ribs, ham, sausage, salami and bacon. Fried meat or protein, including fried fish and fried chicken. Nuts and nut butters. Dairy Whole milk and chocolate milk. Sour cream. Cream. Ice cream. Cream cheese. Milk shakes. Beverages Coffee and tea, with or without caffeine. Carbonated beverages. Sodas. Energy drinks. Fruit juice made with acidic fruits (such as orange or grapefruit). Tomato juice. Alcoholic drinks. Fats and oils Butter. Margarine. Shortening. Ghee. Sweets and desserts Chocolate and cocoa. Donuts. Seasoning and other foods Pepper. Peppermint and spearmint. Any condiments, herbs, or seasonings that cause symptoms. For some people, this may include curry, hot sauce, or vinegar-based salad dressings. Summary  When you have gastroesophageal reflux disease (GERD), food and lifestyle choices are very important to help ease the discomfort of GERD.  Eat frequent, small meals instead of three large meals each day. Eat your meals slowly, in a relaxed setting. Avoid bending over or lying down until 2-3 hours after eating.  Limit high-fat   foods such as fatty meat or fried foods. This information is not intended to replace advice given to you by your health care provider. Make sure you discuss any questions you have with your health care provider. Document Revised:  12/08/2018 Document Reviewed: 08/18/2016 Elsevier Patient Education  2020 Elsevier Inc.  

## 2019-12-13 NOTE — Progress Notes (Signed)
Subjective:     Patient ID: Jose Barker, male   DOB: [birth year], 44 y.o.   MRN: [id number]  HPI   Jose Barker is seen for physical exam.  Generally doing well.  He has been fairly isolated during the past year with the pandemic and not exercising regularly.  Works about 60 hours/week and is mostly sedentary.  He states he has had some mild weight gain but looking back only about 1 pound weight gain since last year.  He had vasovagal type syncopal episode 2019 but none since  Tetanus is up-to-date.  He had Pfizer Covid vaccine and he has completed that series.  No history of smoking.  No regular alcohol use.  Family history reviewed with no major changes.  He has been waking up about twice per week with some reflux symptoms.  Usually after heavy meal and sometimes after alcohol.  Sometimes has some associated coughing related to that.  No dysphagia.  No past medical history on file. No past surgical history on file.  reports that he has never smoked. He has never used smokeless tobacco. He reports current alcohol use. No history on file for drug. family history includes Arthritis in an other family member; Cancer in an other family member; Cancer (age of onset: 69) in his sister; Hypertension in his brother and father; Parkinsonism in his father. No Known Allergies   Review of Systems  Constitutional: Negative for activity change, appetite change, fatigue and fever.  HENT: Negative for congestion, ear pain and trouble swallowing.   Eyes: Negative for pain and visual disturbance.  Respiratory: Negative for cough, shortness of breath and wheezing.   Cardiovascular: Negative for chest pain and palpitations.  Gastrointestinal: Negative for abdominal distention, abdominal pain, blood in stool, constipation, diarrhea, nausea, rectal pain and vomiting.  Endocrine: Negative for polydipsia and polyuria.  Genitourinary: Negative for dysuria, hematuria and testicular pain.  Musculoskeletal: Negative for  arthralgias and joint swelling.  Skin: Negative for rash.  Neurological: Negative for dizziness, syncope and headaches.  Hematological: Negative for adenopathy.  Psychiatric/Behavioral: Negative for confusion and dysphoric mood.       Objective:   Physical Exam Vitals reviewed.  Constitutional:      General: He is not in acute distress.    Appearance: Normal appearance. He is well-developed.  HENT:     Head: Normocephalic and atraumatic.     Right Ear: External ear normal.     Left Ear: External ear normal.  Eyes:     Conjunctiva/sclera: Conjunctivae normal.     Pupils: Pupils are equal, round, and reactive to light.  Neck:     Thyroid: No thyromegaly.  Cardiovascular:     Rate and Rhythm: Normal rate and regular rhythm.     Heart sounds: Normal heart sounds. No murmur.  Pulmonary:     Effort: No respiratory distress.     Breath sounds: No wheezing or rales.  Abdominal:     General: Bowel sounds are normal. There is no distension.     Palpations: Abdomen is soft. There is no mass.     Tenderness: There is no abdominal tenderness. There is no guarding or rebound.  Musculoskeletal:     Cervical back: Normal range of motion and neck supple.  Lymphadenopathy:     Cervical: No cervical adenopathy.  Skin:    Findings: No rash.  Neurological:     Mental Status: He is alert and oriented to person, place, and time.     Cranial Nerves: No cranial  nerve deficit.     Deep Tendon Reflexes: Reflexes normal.        Assessment:     Physical exam.  Generally healthy.  He is overweight for his height.    Plan:     -Obtain screening labs -We discussed importance of trying to get at least 150 minutes/week of moderate exercise. -Encouraged to lose some weight -We discussed measures to reduce GERD including avoidance of eating within 2 to 3 hours of bedtime and also consider elevate head of bed 4 to 6 inches.  Consider Pepcid 20 mg once or twice daily as needed for reflux  symptoms -Handout on GERD given  Eulas Post MD [facility]

## 2019-12-13 NOTE — Addendum Note (Signed)
Addended by: Raiford Simmonds R on: 12/13/2019 10:15 AM   Modules accepted: Orders

## 2019-12-15 ENCOUNTER — Other Ambulatory Visit: Payer: Self-pay

## 2019-12-15 DIAGNOSIS — E559 Vitamin D deficiency, unspecified: Secondary | ICD-10-CM

## 2020-06-03 NOTE — Addendum Note (Signed)
Addended by: Lerry Liner on: 06/03/2020 03:19 PM   Modules accepted: Orders

## 2020-06-10 ENCOUNTER — Other Ambulatory Visit: Payer: Self-pay

## 2020-06-10 ENCOUNTER — Other Ambulatory Visit (INDEPENDENT_AMBULATORY_CARE_PROVIDER_SITE_OTHER): Payer: 59

## 2020-06-10 DIAGNOSIS — E559 Vitamin D deficiency, unspecified: Secondary | ICD-10-CM | POA: Diagnosis not present

## 2020-06-10 LAB — VITAMIN D 25 HYDROXY (VIT D DEFICIENCY, FRACTURES): Vit D, 25-Hydroxy: 29 ng/mL — ABNORMAL LOW (ref 30–100)

## 2020-12-25 ENCOUNTER — Telehealth (INDEPENDENT_AMBULATORY_CARE_PROVIDER_SITE_OTHER): Payer: 59 | Admitting: Internal Medicine

## 2020-12-25 ENCOUNTER — Encounter: Payer: Self-pay | Admitting: Internal Medicine

## 2020-12-25 VITALS — BP 112/74 | Temp 100.6°F

## 2020-12-25 DIAGNOSIS — U071 COVID-19: Secondary | ICD-10-CM | POA: Diagnosis not present

## 2020-12-25 DIAGNOSIS — Z79899 Other long term (current) drug therapy: Secondary | ICD-10-CM | POA: Diagnosis not present

## 2020-12-25 NOTE — Progress Notes (Signed)
Virtual Visit via Video Note  I connected with@ on 12/25/20 at  4:00 PM EDT by a video enabled telemedicine application and verified that I am speaking with the correct person using two identifiers. Location patient: home Location provider:work office Persons participating in the virtual visit: patient, provider  [place] national recommendations  regarding COVID 19 pandemic   video visit is advised over in office visit for this patient.  Patient aware  of the limitations of evaluation and management by telemedicine and  availability of in person appointments. and agreed to proceed.   HPI: Jose Barker presents for video visit because of symptoms and positive COVID-19 test. He has had triple vaccination was out and about 4 to 5 days ago yesterday began to have a slight headache may be a scratchy throat and then body aches today temperature 100.6.  Home positive test for COVID went to [facility] had a positive rapid antigen test and was given Paxlovid prescription.  Has not taken it yet like to consult with the physician first. He has been taking acetaminophen 750 some help for the body aches.  No respiratory distress.  Has had some diarrhea. Taking vitamin D 2000 units. No underlying risk factors except for age and BMI. Lives in the household with wife and 58 year old son who are well and isolating.  Works from home  ROS: See pertinent positives and negatives per HPI.  History reviewed. No pertinent past medical history.  History reviewed. No pertinent surgical history.  Family History  Problem Relation Age of Onset  . Hypertension Father   . Parkinsonism Father   . Hypertension Brother   . Arthritis Other   . Cancer Other        breast, aunt  . Cancer Sister 62       breast cancer    Social History   Tobacco Use  . Smoking status: Never Smoker  . Smokeless tobacco: Never Used  Substance Use Topics  . Alcohol use: Yes      Current Outpatient Medications:  Marland Kitchen  VITAMIN D PO, Take  2,000 mcg by mouth., Disp: , Rfl:   EXAM: BP Readings from Last 3 Encounters:  12/25/20 112/74  12/13/19 112/68  10/19/17 102/63    VITALS per patient if applicable:  GENERAL: alert, oriented, appears well and in no acute distress  HEENT: atraumatic, conjunttiva clear, no obvious abnormalities on inspection of external nose and ears  NECK: normal movements of the head and neck  LUNGS: on inspection no signs of respiratory distress, breathing rate appears normal, no obvious gross SOB, gasping or wheezing  CV: no obvious cyanosis  MS: moves all visible extremities without noticeable abnormality  PSYCH/NEURO: pleasant and cooperative, no obvious depression or anxiety, speech and thought processing grossly intact Lab Results  Component Value Date   WBC 6.0 12/13/2019   HGB 15.1 12/13/2019   HCT 44.6 12/13/2019   PLT 219.0 12/13/2019   GLUCOSE 89 12/13/2019   CHOL 247 (H) 12/13/2019   TRIG 133.0 12/13/2019   HDL 62.10 12/13/2019   LDLDIRECT 166.0 04/26/2015   LDLCALC 158 (H) 12/13/2019   ALT 16 12/13/2019   AST 16 12/13/2019   NA 135 12/13/2019   K 4.2 12/13/2019   CL 102 12/13/2019   CREATININE 0.98 12/13/2019   BUN 13 12/13/2019   CO2 25 12/13/2019   TSH 1.96 12/13/2019    ASSESSMENT AND PLAN:  Discussed the following assessment and plan:    ICD-10-CM   1. COVID-19 virus infection  U07.1    Triple vaccinated MI risk early  2. Medication management  Z79.899    Expectant management Tylenol and or ibuprofen for fever option to take the Paxlovid  but should be taken within the first 5 days if still has a fever in the morning go ahead and begin medication. ( gfr nl ;ast year  ) 10-day isolation from symptoms.  Get back with [place] if any unusual symptoms relapsing chest pain shortness of breath etc. Counseled. About med dx sx and   Alarm sx    Stay hydrated  May get a cough  Expectant management and discussion of plan and treatment with opportunity to ask questions and  all were answered. The patient agreed with the plan and demonstrated an understanding of the instructions.  Ok to take vit D3  2000 iu Advised to call back or seek an in-person evaluation if worsening  or having  further concerns . Return if symptoms worsen or fail to improve as expected.    Berniece Andreas, MD

## 2021-02-18 ENCOUNTER — Encounter: Payer: Self-pay | Admitting: Family Medicine

## 2021-02-18 ENCOUNTER — Other Ambulatory Visit: Payer: Self-pay

## 2021-02-18 ENCOUNTER — Ambulatory Visit (INDEPENDENT_AMBULATORY_CARE_PROVIDER_SITE_OTHER): Payer: 59 | Admitting: Family Medicine

## 2021-02-18 VITALS — BP 130/64 | HR 80 | Temp 98.2°F | Ht 69.75 in | Wt 212.4 lb

## 2021-02-18 DIAGNOSIS — E559 Vitamin D deficiency, unspecified: Secondary | ICD-10-CM | POA: Diagnosis not present

## 2021-02-18 DIAGNOSIS — Z0000 Encounter for general adult medical examination without abnormal findings: Secondary | ICD-10-CM | POA: Diagnosis not present

## 2021-02-18 LAB — LIPID PANEL
Cholesterol: 234 mg/dL — ABNORMAL HIGH (ref 0–200)
HDL: 67.5 mg/dL (ref >39.00)
LDL Cholesterol: 138 mg/dL — ABNORMAL HIGH (ref 0–99)
NonHDL: 166.87
Total CHOL/HDL Ratio: 3
Triglycerides: 146 mg/dL (ref 0.0–149.0)
VLDL: 29.2 mg/dL (ref 0.0–40.0)

## 2021-02-18 LAB — HEPATIC FUNCTION PANEL
ALT: 17 U/L (ref 0–53)
AST: 18 U/L (ref 0–37)
Albumin: 4.4 g/dL (ref 3.5–5.2)
Alkaline Phosphatase: 53 U/L (ref 39–117)
Bilirubin, Direct: 0.1 mg/dL (ref 0.0–0.3)
Total Bilirubin: 0.8 mg/dL (ref 0.2–1.2)
Total Protein: 7.2 g/dL (ref 6.0–8.3)

## 2021-02-18 LAB — CBC WITH DIFFERENTIAL/PLATELET
Basophils Absolute: 0 10*3/uL (ref 0.0–0.1)
Basophils Relative: 0.7 % (ref 0.0–3.0)
Eosinophils Absolute: 0.1 10*3/uL (ref 0.0–0.7)
Eosinophils Relative: 1.4 % (ref 0.0–5.0)
HCT: 42.9 % (ref 39.0–52.0)
Hemoglobin: 14.8 g/dL (ref 13.0–17.0)
Lymphocytes Relative: 30 % (ref 12.0–46.0)
Lymphs Abs: 1.6 10*3/uL (ref 0.7–4.0)
MCHC: 34.5 g/dL (ref 30.0–36.0)
MCV: 88.1 fl (ref 78.0–100.0)
Monocytes Absolute: 0.4 10*3/uL (ref 0.1–1.0)
Monocytes Relative: 7 % (ref 3.0–12.0)
Neutro Abs: 3.3 10*3/uL (ref 1.4–7.7)
Neutrophils Relative %: 60.9 % (ref 43.0–77.0)
Platelets: 213 10*3/uL (ref 150.0–400.0)
RBC: 4.87 Mil/uL (ref 4.22–5.81)
RDW: 12.3 % (ref 11.5–15.5)
WBC: 5.4 10*3/uL (ref 4.0–10.5)

## 2021-02-18 LAB — BASIC METABOLIC PANEL
BUN: 11 mg/dL (ref 6–23)
CO2: 23 mEq/L (ref 19–32)
Calcium: 9.2 mg/dL (ref 8.4–10.5)
Chloride: 103 mEq/L (ref 96–112)
Creatinine, Ser: 0.87 mg/dL (ref 0.40–1.50)
GFR: 104.52 mL/min (ref >60.00)
Glucose, Bld: 85 mg/dL (ref 70–99)
Potassium: 4 mEq/L (ref 3.5–5.1)
Sodium: 137 mEq/L (ref 135–145)

## 2021-02-18 LAB — TSH: TSH: 1.53 u[IU]/mL (ref 0.35–4.50)

## 2021-02-18 LAB — VITAMIN D 25 HYDROXY (VIT D DEFICIENCY, FRACTURES): VITD: 27.02 ng/mL — ABNORMAL LOW (ref 30.00–100.00)

## 2021-02-18 NOTE — Addendum Note (Signed)
Addended by: Kristian Covey on: 02/18/2021 12:40 PM   Modules accepted: Orders

## 2021-02-18 NOTE — Progress Notes (Addendum)
Established Patient Office Visit  Subjective:  Patient ID: Jose Barker, male    DOB: 1976/01/12  Age: 45 y.o. MRN: [id number]  CC:  Chief Complaint  Patient presents with   Annual Exam    No new concerns     HPI [name] presents for physical exam.  Generally healthy.  Takes no medications.  No chronic medical problems.  Preparing to travel to [place] in 1 week to visit family.  He did have COVID about a month ago.  He was concerned because he was aware that he may not test negative at this time.  He has no symptoms.  Has not done follow-up test though to confirm but was inquiring about a possible need for a letter if his rapid test comes back positive still  Health maintenance reviewed  -No indication for Pneumovax -No history of hepatitis C screening and patient is low risk -Has turned 45 with no prior history of colonoscopy screening -Tetanus due 2024  Social history he is married.  He has a daughter who is 66 who attends [employer].  She is premed.  2 year old son.  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  Does drink some alcohol generally on weekends but not regularly.  Never smoked.  Family history-mother died age 59 complications of head trauma.  Father had Parkinson's disease.  8 siblings.  1 brother with nonalcoholic cirrhosis.  He is not sure regarding exact etiology.  Another brother with history of hypertension and depression.  Sister with breast cancer.  Another sister with depression history  The 10-year ASCVD risk score Denman George DC Montez Hageman., et al., 2013) is: 1.9%   Values used to calculate the score:     Age: 75 years     Sex: Male     Is Non-Hispanic African American: No     Diabetic: No     Tobacco smoker: No     Systolic Blood Pressure: 130 mmHg     Is BP treated: No     HDL Cholesterol: 67.5 mg/dL     Total Cholesterol: 234 mg/dL    No past medical history on file.  No past surgical history on file.  Family History  Problem Relation Age of Onset    Hypertension Father    Parkinsonism Father    Cancer Sister 33       breast cancer   Hypertension Brother    Arthritis Other    Cancer Other        breast, aunt    Social History   Socioeconomic History   Marital status: Married    Spouse name: Not on file   Number of children: Not on file   Years of education: Not on file   Highest education level: Not on file  Occupational History   Not on file  Tobacco Use   Smoking status: Never   Smokeless tobacco: Never  Substance and Sexual Activity   Alcohol use: Yes   Drug use: Not on file   Sexual activity: Not on file  Other Topics Concern   Not on file  Social History Narrative   Not on file   Social Determinants of Health   Financial Resource Strain: Not on file  Food Insecurity: Not on file  Transportation Needs: Not on file  Physical Activity: Not on file  Stress: Not on file  Social Connections: Not on file  Intimate Partner Violence: Not on file    Outpatient Medications Prior to Visit  Medication Sig  Dispense Refill   VITAMIN D PO Take 2,000 mcg by mouth.     No facility-administered medications prior to visit.    No Known Allergies  ROS Review of Systems  Constitutional:  Negative for activity change, appetite change, fatigue, fever and unexpected weight change.  HENT:  Negative for congestion, ear pain and trouble swallowing.   Eyes:  Negative for pain and visual disturbance.  Respiratory:  Negative for cough, shortness of breath and wheezing.   Cardiovascular:  Negative for chest pain and palpitations.  Gastrointestinal:  Negative for abdominal distention, abdominal pain, blood in stool, constipation, diarrhea, nausea, rectal pain and vomiting.  Endocrine: Negative for polydipsia and polyuria.  Genitourinary:  Negative for dysuria, hematuria and testicular pain.  Musculoskeletal:  Negative for arthralgias and joint swelling.  Skin:  Negative for rash.  Neurological:  Negative for dizziness, syncope  and headaches.  Hematological:  Negative for adenopathy.  Psychiatric/Behavioral:  Negative for confusion and dysphoric mood.      Objective:    Physical Exam Constitutional:      General: He is not in acute distress.    Appearance: He is well-developed.  HENT:     Head: Normocephalic and atraumatic.     Right Ear: External ear normal.     Left Ear: External ear normal.  Eyes:     Conjunctiva/sclera: Conjunctivae normal.     Pupils: Pupils are equal, round, and reactive to light.  Neck:     Thyroid: No thyromegaly.  Cardiovascular:     Rate and Rhythm: Normal rate and regular rhythm.     Heart sounds: Normal heart sounds. No murmur heard. Pulmonary:     Effort: No respiratory distress.     Breath sounds: No wheezing or rales.  Abdominal:     General: Bowel sounds are normal. There is no distension.     Palpations: Abdomen is soft. There is no mass.     Tenderness: There is no abdominal tenderness. There is no guarding or rebound.  Musculoskeletal:     Cervical back: Normal range of motion and neck supple.     Right lower leg: No edema.     Left lower leg: No edema.  Lymphadenopathy:     Cervical: No cervical adenopathy.  Skin:    Findings: No rash.  Neurological:     Mental Status: He is alert and oriented to person, place, and time.     Cranial Nerves: No cranial nerve deficit.     Deep Tendon Reflexes: Reflexes normal.    BP 130/64 (BP Location: Left Arm, Patient Position: Sitting, Cuff Size: Normal)   Pulse 80   Temp 98.2 F (36.8 C) (Oral)   Ht 5' 9.75" (1.772 m)   Wt 212 lb 6.4 oz (96.3 kg)   SpO2 97%   BMI 30.70 kg/m  Wt Readings from Last 3 Encounters:  02/18/21 212 lb 6.4 oz (96.3 kg)  12/13/19 213 lb 4.8 oz (96.8 kg)  10/19/17 212 lb 6.4 oz (96.3 kg)     Health Maintenance Due  Topic Date Due   Pneumococcal Vaccine 104-54 Years old (1 - PCV) Never done   HIV Screening  Never done   Hepatitis C Screening  Never done   COVID-19 Vaccine (4 -  Booster for Pfizer series) 10/02/2020   COLONOSCOPY (Pts 45-23yrs Insurance coverage will need to be confirmed)  Never done    There are no preventive care reminders to display for this patient.  Lab Results  Component Value  Date   TSH 1.96 12/13/2019   Lab Results  Component Value Date   WBC 6.0 12/13/2019   HGB 15.1 12/13/2019   HCT 44.6 12/13/2019   MCV 88.5 12/13/2019   PLT 219.0 12/13/2019   Lab Results  Component Value Date   NA 135 12/13/2019   K 4.2 12/13/2019   CO2 25 12/13/2019   GLUCOSE 89 12/13/2019   BUN 13 12/13/2019   CREATININE 0.98 12/13/2019   BILITOT 0.7 12/13/2019   ALKPHOS 65 12/13/2019   AST 16 12/13/2019   ALT 16 12/13/2019   PROT 7.1 12/13/2019   ALBUMIN 4.4 12/13/2019   CALCIUM 9.1 12/13/2019   GFR 83.13 12/13/2019   Lab Results  Component Value Date   CHOL 247 (H) 12/13/2019   Lab Results  Component Value Date   HDL 62.10 12/13/2019   Lab Results  Component Value Date   LDLCALC 158 (H) 12/13/2019   Lab Results  Component Value Date   TRIG 133.0 12/13/2019   Lab Results  Component Value Date   CHOLHDL 4 12/13/2019   No results found for: HGBA1C    Assessment & Plan:   Problem List Items Addressed This Visit   None Visit Diagnoses     Physical exam    -  Primary   Relevant Orders   Basic metabolic panel   Lipid panel   CBC with Differential/Platelet   TSH   Hepatic function panel   VITAMIN D 25 Hydroxy (Vit-D Deficiency, Fractures)   Hep C Antibody   Vitamin D deficiency       Relevant Orders   VITAMIN D 25 Hydroxy (Vit-D Deficiency, Fractures)     Generally healthy 45 year old male.  We discussed the following health maintenance items  -Recommend minimum 150 minutes of moderately strenuous activity with exercise per week -Discussed setting up screening colonoscopy.  He will check with insurance regarding coverage -Check labs as above including hepatitis C antibody.  Patient also requesting vitamin D level.  Has  had low levels in the past.  No orders of the defined types were placed in this encounter.   Follow-up: No follow-ups on file.    Evelena Peat, MD

## 2021-02-18 NOTE — Progress Notes (Signed)
nnn

## 2021-02-18 NOTE — Patient Instructions (Signed)
I will set up screening colonoscopy.

## 2021-02-19 LAB — HEPATITIS C ANTIBODY
Hepatitis C Ab: NONREACTIVE
SIGNAL TO CUT-OFF: 0 (ref <1.00)

## 2022-03-13 ENCOUNTER — Encounter: Payer: Self-pay | Admitting: Family Medicine

## 2022-03-13 ENCOUNTER — Ambulatory Visit (INDEPENDENT_AMBULATORY_CARE_PROVIDER_SITE_OTHER): Payer: 59 | Admitting: Family Medicine

## 2022-03-13 VITALS — BP 110/70 | HR 73 | Temp 98.0°F | Ht 70.08 in | Wt 221.3 lb

## 2022-03-13 DIAGNOSIS — Z0000 Encounter for general adult medical examination without abnormal findings: Secondary | ICD-10-CM

## 2022-03-13 LAB — CBC WITH DIFFERENTIAL/PLATELET
Basophils Absolute: 0 10*3/uL (ref 0.0–0.1)
Basophils Relative: 0.5 % (ref 0.0–3.0)
Eosinophils Absolute: 0.1 10*3/uL (ref 0.0–0.7)
Eosinophils Relative: 1.7 % (ref 0.0–5.0)
HCT: 44.1 % (ref 39.0–52.0)
Hemoglobin: 14.8 g/dL (ref 13.0–17.0)
Lymphocytes Relative: 27.4 % (ref 12.0–46.0)
Lymphs Abs: 1.5 10*3/uL (ref 0.7–4.0)
MCHC: 33.5 g/dL (ref 30.0–36.0)
MCV: 89.4 fl (ref 78.0–100.0)
Monocytes Absolute: 0.4 10*3/uL (ref 0.1–1.0)
Monocytes Relative: 7.6 % (ref 3.0–12.0)
Neutro Abs: 3.5 10*3/uL (ref 1.4–7.7)
Neutrophils Relative %: 62.8 % (ref 43.0–77.0)
Platelets: 203 10*3/uL (ref 150.0–400.0)
RBC: 4.93 Mil/uL (ref 4.22–5.81)
RDW: 13.1 % (ref 11.5–15.5)
WBC: 5.5 10*3/uL (ref 4.0–10.5)

## 2022-03-13 LAB — BASIC METABOLIC PANEL
BUN: 14 mg/dL (ref 6–23)
CO2: 23 mEq/L (ref 19–32)
Calcium: 9.1 mg/dL (ref 8.4–10.5)
Chloride: 107 mEq/L (ref 96–112)
Creatinine, Ser: 0.88 mg/dL (ref 0.40–1.50)
GFR: 103.39 mL/min (ref >60.00)
Glucose, Bld: 96 mg/dL (ref 70–99)
Potassium: 4.1 mEq/L (ref 3.5–5.1)
Sodium: 138 mEq/L (ref 135–145)

## 2022-03-13 LAB — LIPID PANEL
Cholesterol: 275 mg/dL — ABNORMAL HIGH (ref 0–200)
HDL: 72.5 mg/dL (ref >39.00)
LDL Cholesterol: 165 mg/dL — ABNORMAL HIGH (ref 0–99)
NonHDL: 202.63
Total CHOL/HDL Ratio: 4
Triglycerides: 190 mg/dL — ABNORMAL HIGH (ref 0.0–149.0)
VLDL: 38 mg/dL (ref 0.0–40.0)

## 2022-03-13 LAB — HEPATIC FUNCTION PANEL
ALT: 34 U/L (ref 0–53)
AST: 31 U/L (ref 0–37)
Albumin: 4.3 g/dL (ref 3.5–5.2)
Alkaline Phosphatase: 53 U/L (ref 39–117)
Bilirubin, Direct: 0.1 mg/dL (ref 0.0–0.3)
Total Bilirubin: 0.5 mg/dL (ref 0.2–1.2)
Total Protein: 7.3 g/dL (ref 6.0–8.3)

## 2022-03-13 LAB — VITAMIN D 25 HYDROXY (VIT D DEFICIENCY, FRACTURES): VITD: 27.72 ng/mL — ABNORMAL LOW (ref 30.00–100.00)

## 2022-03-13 LAB — TSH: TSH: 2.36 u[IU]/mL (ref 0.35–5.50)

## 2022-03-13 NOTE — Patient Instructions (Signed)
Consider Colonoscopy- check on insurance coverage and let us know if interested.

## 2022-03-13 NOTE — Progress Notes (Signed)
Established Patient Office Visit  Subjective   Patient ID: Jose Barker, male    DOB: [birth year]  Age: 46 y.o. MRN: [id number]  No chief complaint on file.   HPI   Here for complete physical.  He takes no regular medications.  No significant chronic medical problems.  Currently not exercising much.  Works for [employer].  His family is generally doing well.  He continues to visit [place] usually about twice per year to visit family.  Health maintenance reviewed  -No history of screening colonoscopy.  He would consider but plans to check on insurance coverage first -Tetanus due 2024 -Prior hepatitis C screening negative.  Social history and family history reviewed:  Social history he is married.  He has a daughter who is 58 who attends [employer].  She is premed.  81year-old son.  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  Does drink some alcohol generally on weekends but not regularly.  Never smoked.   Family history-mother died age 88 complications of head trauma.  Father had Parkinson's disease.  8 siblings.  1 brother with nonalcoholic cirrhosis.  He is not sure regarding exact etiology.  Another brother with history of hypertension and depression.  Sister with breast cancer.  Another sister with depression history  History reviewed. No pertinent past medical history. History reviewed. No pertinent surgical history.  reports that he has never smoked. He has never used smokeless tobacco. He reports current alcohol use. No history on file for drug use. family history includes Arthritis in an other family member; Cancer in an other family member; Cancer (age of onset: 27) in his sister; Hypertension in his brother and father; Parkinsonism in his father. No Known Allergies   Review of Systems  Constitutional:  Negative for chills, fever, malaise/fatigue and weight loss.  HENT:  Negative for hearing loss.   Eyes:  Negative for blurred vision and double vision.   Respiratory:  Negative for cough and shortness of breath.   Cardiovascular:  Negative for chest pain, palpitations and leg swelling.  Gastrointestinal:  Negative for abdominal pain, blood in stool, constipation and diarrhea.  Genitourinary:  Negative for dysuria.  Skin:  Negative for rash.  Neurological:  Negative for dizziness, speech change, seizures, loss of consciousness and headaches.  Psychiatric/Behavioral:  Negative for depression.       Objective:     BP 110/70 (BP Location: Left Arm, Patient Position: Sitting, Cuff Size: Normal)   Pulse 73   Temp 98 F (36.7 C) (Oral)   Ht 5' 10.08" (1.78 m)   Wt 221 lb 4.8 oz (100.4 kg)   SpO2 98%   BMI 31.68 kg/m    Physical Exam Constitutional:      General: He is not in acute distress.    Appearance: He is well-developed.  HENT:     Head: Normocephalic and atraumatic.     Right Ear: External ear normal.     Left Ear: External ear normal.  Eyes:     Conjunctiva/sclera: Conjunctivae normal.     Pupils: Pupils are equal, round, and reactive to light.  Neck:     Thyroid: No thyromegaly.  Cardiovascular:     Rate and Rhythm: Normal rate and regular rhythm.     Heart sounds: Normal heart sounds. No murmur heard. Pulmonary:     Effort: No respiratory distress.     Breath sounds: No wheezing or rales.  Abdominal:     General: Bowel sounds are normal. There  is no distension.     Palpations: Abdomen is soft. There is no mass.     Tenderness: There is no abdominal tenderness. There is no guarding or rebound.  Musculoskeletal:     Cervical back: Normal range of motion and neck supple.     Right lower leg: No edema.     Left lower leg: No edema.  Lymphadenopathy:     Cervical: No cervical adenopathy.  Skin:    Findings: No rash.  Neurological:     Mental Status: He is alert and oriented to person, place, and time.     Cranial Nerves: No cranial nerve deficit.      Results for orders placed or performed in visit on  03/13/22  VITAMIN D 25 Hydroxy (Vit-D Deficiency, Fractures)  Result Value Ref Range   VITD 27.72 (L) 30.00 - 100.00 ng/mL  Hepatic function panel  Result Value Ref Range   Total Bilirubin 0.5 0.2 - 1.2 mg/dL   Bilirubin, Direct 0.1 0.0 - 0.3 mg/dL   Alkaline Phosphatase 53 39 - 117 U/L   AST 31 0 - 37 U/L   ALT 34 0 - 53 U/L   Total Protein 7.3 6.0 - 8.3 g/dL   Albumin 4.3 3.5 - 5.2 g/dL  TSH  Result Value Ref Range   TSH 2.36 0.35 - 5.50 uIU/mL  CBC with Differential/Platelet  Result Value Ref Range   WBC 5.5 4.0 - 10.5 K/uL   RBC 4.93 4.22 - 5.81 Mil/uL   Hemoglobin 14.8 13.0 - 17.0 g/dL   HCT 98.9 21.1 - 94.1 %   MCV 89.4 78.0 - 100.0 fl   MCHC 33.5 30.0 - 36.0 g/dL   RDW 74.0 81.4 - 48.1 %   Platelets 203.0 150.0 - 400.0 K/uL   Neutrophils Relative % 62.8 43.0 - 77.0 %   Lymphocytes Relative 27.4 12.0 - 46.0 %   Monocytes Relative 7.6 3.0 - 12.0 %   Eosinophils Relative 1.7 0.0 - 5.0 %   Basophils Relative 0.5 0.0 - 3.0 %   Neutro Abs 3.5 1.4 - 7.7 K/uL   Lymphs Abs 1.5 0.7 - 4.0 K/uL   Monocytes Absolute 0.4 0.1 - 1.0 K/uL   Eosinophils Absolute 0.1 0.0 - 0.7 K/uL   Basophils Absolute 0.0 0.0 - 0.1 K/uL  Lipid panel  Result Value Ref Range   Cholesterol 275 (H) 0 - 200 mg/dL   Triglycerides 856.3 (H) 0.0 - 149.0 mg/dL   HDL 14.97 >02.63 mg/dL   VLDL 78.5 0.0 - 88.5 mg/dL   LDL Cholesterol 027 (H) 0 - 99 mg/dL   Total CHOL/HDL Ratio 4    NonHDL 202.63   Basic metabolic panel  Result Value Ref Range   Sodium 138 135 - 145 mEq/L   Potassium 4.1 3.5 - 5.1 mEq/L   Chloride 107 96 - 112 mEq/L   CO2 23 19 - 32 mEq/L   Glucose, Bld 96 70 - 99 mg/dL   BUN 14 6 - 23 mg/dL   Creatinine, Ser 7.41 0.40 - 1.50 mg/dL   GFR 287.86 >76.72 mL/min   Calcium 9.1 8.4 - 10.5 mg/dL      The 09-OBSJ ASCVD risk score (Arnett DK, et al., 2019) is: 1.9%    Assessment & Plan:   Problem List Items Addressed This Visit   None Visit Diagnoses     Physical exam    -   Primary   Relevant Orders   Basic metabolic panel (Completed)  Lipid panel (Completed)   CBC with Differential/Platelet (Completed)   TSH (Completed)   Hepatic function panel (Completed)   VITAMIN D 25 Hydroxy (Vit-D Deficiency, Fractures) (Completed)   CT CARDIAC SCORING (SELF PAY ONLY)     -Check labs above -We did discuss colon cancer screening and he will check on insurance coverage.  He will get back with [place] if covered by his insurance and he is interested. -Try to establish more consistent exercise-goal of minimum of 150 minutes of moderate aerobic activity per week -He specifically had questions regarding coronary calcium screening.  He is relatively low risk but he would like to proceed nevertheless.  No follow-ups on file.    Evelena Peat, MD

## 2022-04-01 ENCOUNTER — Other Ambulatory Visit (HOSPITAL_BASED_OUTPATIENT_CLINIC_OR_DEPARTMENT_OTHER): Payer: 59

## 2022-04-17 ENCOUNTER — Telehealth: Payer: Self-pay | Admitting: Family Medicine

## 2022-04-17 ENCOUNTER — Other Ambulatory Visit: Payer: 59

## 2022-04-17 DIAGNOSIS — E785 Hyperlipidemia, unspecified: Secondary | ICD-10-CM

## 2022-04-17 NOTE — Telephone Encounter (Signed)
I spoke with the patient on 03/13/2022 regarding his labs and he reported at that time that he would like to hold off on Statin medication due to him wanting to have a Coronary scan first before starting medication. After speaking with the patient today he states his Coronary scan was delayed so he would like to have rx sent in.Patient would also requested to have a Lipid panel done as he states he has lost 15 pounds and improved diet.

## 2022-04-17 NOTE — Telephone Encounter (Signed)
Pt call and stated he want you to give him a call back because he didn't receive the statin that dr.Burchette was sending to the pharmacy the patient stated he want it sent to  [id number] [place], [place] - [address] Phone:  [phone]  Fax:  [phone]    And also want to do some labs

## 2022-04-20 MED ORDER — ROSUVASTATIN CALCIUM 20 MG PO TABS: 20.0000 mg | ORAL_TABLET | Freq: Every day | ORAL | 3 refills | Status: DC

## 2022-04-20 NOTE — Addendum Note (Signed)
Addended by: Christy Sartorius on: 04/20/2022 01:12 PM   Modules accepted: Orders

## 2022-04-20 NOTE — Telephone Encounter (Signed)
Patient informed that rx was sent. Patient inquired if he can have lipid panel drawn before starting Crestor? Patient believes his previous blood work may be impacted because he had a bad diet for a couple of days prior to his appointment on 03/13/2022.

## 2022-04-20 NOTE — Addendum Note (Signed)
Addended by: Christy Sartorius on: 04/20/2022 09:09 AM   Modules accepted: Orders

## 2022-04-20 NOTE — Telephone Encounter (Signed)
Labs placed and patient aware to schedule lab appt

## 2022-04-24 ENCOUNTER — Other Ambulatory Visit: Payer: 59

## 2022-04-24 ENCOUNTER — Other Ambulatory Visit (INDEPENDENT_AMBULATORY_CARE_PROVIDER_SITE_OTHER): Payer: 59

## 2022-04-24 ENCOUNTER — Ambulatory Visit (HOSPITAL_BASED_OUTPATIENT_CLINIC_OR_DEPARTMENT_OTHER)
Admission: RE | Admit: 2022-04-24 | Discharge: 2022-04-24 | Disposition: A | Discharge status: Home / Self Care | Payer: 59 | Source: Ambulatory Visit | Attending: Family Medicine | Admitting: Family Medicine

## 2022-04-24 ENCOUNTER — Telehealth: Payer: Self-pay | Admitting: Family Medicine

## 2022-04-24 DIAGNOSIS — Z0000 Encounter for general adult medical examination without abnormal findings: Secondary | ICD-10-CM | POA: Insufficient documentation

## 2022-04-24 DIAGNOSIS — E785 Hyperlipidemia, unspecified: Secondary | ICD-10-CM

## 2022-04-24 LAB — LIPID PANEL
Cholesterol: 199 mg/dL (ref 0–200)
HDL: 58.9 mg/dL (ref >39.00)
LDL Cholesterol: 113 mg/dL — ABNORMAL HIGH (ref 0–99)
NonHDL: 140.42
Total CHOL/HDL Ratio: 3
Triglycerides: 138 mg/dL (ref 0.0–149.0)
VLDL: 27.6 mg/dL (ref 0.0–40.0)

## 2022-04-24 LAB — COMPREHENSIVE METABOLIC PANEL
ALT: 20 U/L (ref 0–53)
AST: 21 U/L (ref 0–37)
Albumin: 4.4 g/dL (ref 3.5–5.2)
Alkaline Phosphatase: 54 U/L (ref 39–117)
BUN: 10 mg/dL (ref 6–23)
CO2: 22 mEq/L (ref 19–32)
Calcium: 9.3 mg/dL (ref 8.4–10.5)
Chloride: 106 mEq/L (ref 96–112)
Creatinine, Ser: 0.95 mg/dL (ref 0.40–1.50)
GFR: 96.16 mL/min (ref >60.00)
Glucose, Bld: 89 mg/dL (ref 70–99)
Potassium: 4.1 mEq/L (ref 3.5–5.1)
Sodium: 139 mEq/L (ref 135–145)
Total Bilirubin: 0.7 mg/dL (ref 0.2–1.2)
Total Protein: 7.3 g/dL (ref 6.0–8.3)

## 2022-04-24 NOTE — Telephone Encounter (Signed)
Labs placed and add on labs sheet faxed

## 2022-04-24 NOTE — Telephone Encounter (Signed)
Patient wants to add a metabolic panel to labs that were drawn for today. He states that he wants them to be rechecked like the lipids to see where the numbers are. Patient has enough tubes to have the lab added and him not have to come back in.        Please advise

## 2022-04-27 ENCOUNTER — Other Ambulatory Visit (HOSPITAL_BASED_OUTPATIENT_CLINIC_OR_DEPARTMENT_OTHER): Payer: 59

## 2022-04-28 ENCOUNTER — Telehealth: Payer: Self-pay | Admitting: Family Medicine

## 2022-04-28 NOTE — Telephone Encounter (Signed)
Pt requesting to speak with care team regarding labs.

## 2022-04-29 NOTE — Telephone Encounter (Signed)
Noted.  Sounds good.    Jose Barker W Jose Gilberg MD  [facility]  

## 2022-04-29 NOTE — Telephone Encounter (Signed)
Spoke with patient about results. Patient has concerns from Lipid panel on 04/24/22  if he should take the medication Rosuvastatin 20 mg.   Please advise

## 2022-04-29 NOTE — Telephone Encounter (Signed)
FYI  Spoke with patient, he stated that he had not started taking the Rosuvastatin 20 mg when labs were drawn.    Patient stated that he will not take the Rosuvastatin 20 mg, he will maintain healthy diet as suggested, and will schedule  3 month f/u appointment.

## 2023-08-03 ENCOUNTER — Encounter: Payer: Self-pay | Admitting: Family Medicine

## 2023-08-03 ENCOUNTER — Ambulatory Visit (INDEPENDENT_AMBULATORY_CARE_PROVIDER_SITE_OTHER): Payer: 59 | Admitting: Family Medicine

## 2023-08-03 VITALS — BP 110/70 | HR 82 | Temp 98.9°F | Ht 70.0 in | Wt 219.4 lb

## 2023-08-03 DIAGNOSIS — G4700 Insomnia, unspecified: Secondary | ICD-10-CM | POA: Diagnosis not present

## 2023-08-03 DIAGNOSIS — Z23 Encounter for immunization: Secondary | ICD-10-CM | POA: Diagnosis not present

## 2023-08-03 MED ORDER — ZOLPIDEM TARTRATE 10 MG PO TABS: 10.0000 mg | ORAL_TABLET | Freq: Every evening | ORAL | 1 refills | Status: DC | PRN

## 2023-08-03 MED ORDER — TRAZODONE HCL 50 MG PO TABS: 50.0000 mg | ORAL_TABLET | Freq: Every evening | ORAL | 0 refills | Status: DC | PRN

## 2023-08-03 NOTE — Progress Notes (Signed)
Established Patient Office Visit  Subjective   Patient ID: Jose Barker, male    DOB: June 13, 1976  Age: 47 y.o. MRN: [id number]  Chief Complaint  Patient presents with   Travel Consult   Medication Consultation    HPI   Tyreak is here to discuss possible medications for upcoming travel.  His work requires that he travel to [place] next week.  He travel to [place] 2 weeks ago and had tremendous difficulty adjusting to change in time schedule.  He has tried melatonin without much success.  Would like to explore other medication options.  Has not tried any prescription medications to his knowledge previously.  No regular alcohol consumption.  Tries to avoid late the use of caffeine.  Remains on rosuvastatin for hyperlipidemia.  Overdue for follow-up labs.  Needs to set up physical soon.  History reviewed. No pertinent past medical history. History reviewed. No pertinent surgical history.  reports that he has never smoked. He has never used smokeless tobacco. He reports current alcohol use. No history on file for drug use. family history includes Arthritis in an other family member; Cancer in an other family member; Cancer (age of onset: 25) in his sister; Hypertension in his brother and father; Parkinsonism in his father. No Known Allergies  Review of Systems  Constitutional:  Negative for malaise/fatigue.  Eyes:  Negative for blurred vision.  Respiratory:  Negative for shortness of breath.   Cardiovascular:  Negative for chest pain.  Neurological:  Negative for dizziness, weakness and headaches.      Objective:     BP 110/70 (BP Location: Left Arm, Patient Position: Sitting, Cuff Size: Normal)   Pulse 82   Temp 98.9 F (37.2 C) (Oral)   Ht 5\' 10"  (1.778 m)   Wt 219 lb 6.4 oz (99.5 kg)   SpO2 98%   BMI 31.48 kg/m  BP Readings from Last 3 Encounters:  08/03/23 110/70  03/13/22 110/70  02/18/21 130/64   Wt Readings from Last 3 Encounters:  08/03/23 219 lb 6.4 oz (99.5 kg)   03/13/22 221 lb 4.8 oz (100.4 kg)  02/18/21 212 lb 6.4 oz (96.3 kg)      Physical Exam Vitals reviewed.  Constitutional:      General: He is not in acute distress.    Appearance: He is not ill-appearing.  Cardiovascular:     Rate and Rhythm: Normal rate and regular rhythm.  Pulmonary:     Effort: Pulmonary effort is normal.     Breath sounds: Normal breath sounds.  Neurological:     General: No focal deficit present.     Mental Status: He is alert.     Cranial Nerves: No cranial nerve deficit.      No results found for any visits on 08/03/23.    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  Sleep hygiene discussed.  Handout given.  He has not had good success with melatonin previously.  Doubt he would respond to drugs like Rozerem based on his poor response to melatonin.  We discussed other options.  -Discussed nonpharmacologic management with avoidance of late the use of caffeine, avoidance of bright lights, restricting alcohol intake generally at night -Discussed trial of trazodone 50 mg nightly if not seeing adequate results without we wrote for trial of Ambien 10 mg nightly -Recommend short-term use of the above  -Flu vaccine given -Set up complete physical  No follow-ups on file.    Evelena Peat, MD

## 2023-08-27 ENCOUNTER — Other Ambulatory Visit: Payer: Self-pay | Admitting: Family Medicine

## 2024-04-03 ENCOUNTER — Telehealth: Payer: Self-pay | Admitting: Family Medicine

## 2024-04-03 ENCOUNTER — Encounter: Payer: Self-pay | Admitting: Family Medicine

## 2024-04-03 ENCOUNTER — Ambulatory Visit (INDEPENDENT_AMBULATORY_CARE_PROVIDER_SITE_OTHER): Admitting: Family Medicine

## 2024-04-03 VITALS — BP 116/78 | HR 81 | Temp 98.3°F | Ht 68.9 in | Wt 226.5 lb

## 2024-04-03 DIAGNOSIS — Z23 Encounter for immunization: Secondary | ICD-10-CM | POA: Diagnosis not present

## 2024-04-03 DIAGNOSIS — Z0000 Encounter for general adult medical examination without abnormal findings: Secondary | ICD-10-CM | POA: Diagnosis not present

## 2024-04-03 MED ORDER — ZOLPIDEM TARTRATE 10 MG PO TABS: 10.0000 mg | ORAL_TABLET | Freq: Every evening | ORAL | 1 refills | Status: AC | PRN

## 2024-04-03 NOTE — Patient Instructions (Signed)
 I have set up colonoscopy referral.

## 2024-04-03 NOTE — Progress Notes (Signed)
 Established Patient Office Visit  Subjective   Patient ID: Jose Barker, male    DOB: 17-Jun-1976  Age: 48 y.o. MRN: [id number]  Chief Complaint  Patient presents with   Annual Exam    HPI   Jose Barker is seen today for physical exam.  No active medical problems.  He does have a history of vitamin D  deficiency and takes supplement for that.  He is requesting repeat vitamin D  level.  Takes no other regular medications.  His job requires frequent international travel.  He is requesting refill of Ambien  which he has taken for adapting to other time zones.    Previous coronary calcium  score on imaging 2023 of 0.  Health maintenance reviewed:  Health Maintenance  Topic Date Due   HIV Screening  Never done   Hepatitis B Vaccines (1 of 3 - 19+ 3-dose series) Never done   Colonoscopy  Never done   COVID-19 Vaccine (4 - 2024-25 season) 05/02/2023   INFLUENZA VACCINE  03/31/2024   DTaP/Tdap/Td (4 - Td or Tdap) 04/03/2034   Hepatitis C Screening  Completed   HPV VACCINES  Aged Out   Meningococcal B Vaccine  Aged Out   - No history of prior screening colonoscopy and he does agree with setting this up -Due for tetanus and agrees for Tdap today  Social history-he is married.  He has a daughter who just graduated from [employer] and will be starting a [employer] school in the fall.  He has a son currently at [employer] who is majoring in Clinical cytogeneticist.  Patient works for [employer].  Never smoked.  Rare alcohol use.  Family history mother had history of depression and died age 2 complications of head trauma.  Father had history of Parkinson's disease.  8 siblings.  1 brother with nonalcoholic cirrhosis.  Possibly related to NASH.  He has a brother with hypertension and depression and sister with breast cancer.  History reviewed. No pertinent past medical history. History reviewed. No pertinent surgical history.  reports that he has never smoked. He  has never used smokeless tobacco. He reports current alcohol use. No history on file for drug use. family history includes Arthritis in an other family member; Cancer in an other family member; Cancer (age of onset: 39) in his sister; Hypertension in his brother and father; Parkinsonism in his father. No Known Allergies   Review of Systems  Constitutional:  Negative for chills, fever, malaise/fatigue and weight loss.  HENT:  Negative for hearing loss.   Eyes:  Negative for blurred vision and double vision.  Respiratory:  Negative for cough and shortness of breath.   Cardiovascular:  Negative for chest pain, palpitations and leg swelling.  Gastrointestinal:  Negative for abdominal pain, blood in stool, constipation and diarrhea.  Genitourinary:  Negative for dysuria.  Skin:  Negative for rash.  Neurological:  Negative for dizziness, speech change, seizures, loss of consciousness and headaches.  Psychiatric/Behavioral:  Negative for depression.       Objective:     BP 116/78   Pulse 81   Temp 98.3 F (36.8 C) (Oral)   Ht 5' 8.9 (1.75 m)   Wt 226 lb 8 oz (102.7 kg)   SpO2 96%   BMI 33.55 kg/m    Physical Exam Vitals reviewed.  Constitutional:      General: He is not in acute distress.    Appearance: He is well-developed.  HENT:     Head:  Normocephalic and atraumatic.     Right Ear: External ear normal.     Left Ear: External ear normal.  Eyes:     Conjunctiva/sclera: Conjunctivae normal.     Pupils: Pupils are equal, round, and reactive to light.  Neck:     Thyroid : No thyromegaly.  Cardiovascular:     Rate and Rhythm: Normal rate and regular rhythm.     Heart sounds: Normal heart sounds. No murmur heard. Pulmonary:     Effort: No respiratory distress.     Breath sounds: No wheezing or rales.  Abdominal:     General: Bowel sounds are normal. There is no distension.     Palpations: Abdomen is soft. There is no mass.     Tenderness: There is no abdominal  tenderness. There is no guarding or rebound.  Musculoskeletal:     Cervical back: Normal range of motion and neck supple.     Right lower leg: No edema.     Left lower leg: No edema.  Lymphadenopathy:     Cervical: No cervical adenopathy.  Skin:    Findings: No rash.  Neurological:     Mental Status: He is alert and oriented to person, place, and time.     Cranial Nerves: No cranial nerve deficit.      No results found for any visits on 04/03/24.    The 10-year ASCVD risk score (Arnett DK, et al., 2019) is: 2%    Assessment & Plan:   Problem List Items Addressed This Visit   None Visit Diagnoses       Physical exam    -  Primary   Relevant Orders   Ambulatory referral to Gastroenterology   Basic metabolic panel with GFR   Lipid panel   CBC with Differential/Platelet   Hepatic function panel   PSA   Hemoglobin A1c   VITAMIN D  25 Hydroxy (Vit-D Deficiency, Fractures)     Need for tetanus booster       Relevant Orders   Tdap vaccine greater than or equal to 7yo IM (Completed)     - Obtain screening labs as above - Recommend annual flu vaccine - Set up initial screening colonoscopy - Tdap given - Consider pneumonia vaccination and Shingrix at age 9 - Refill Ambien  for as needed use for his international travel - Recommend minimum of 150 minutes/week of moderate intensity exercise such as brisk walking  No follow-ups on file.    Wolm Scarlet, MD

## 2024-04-03 NOTE — Telephone Encounter (Signed)
 Pt states he got got ok from Dr. Micheal to take his son as a new patient.  I just need an okay for this to be able to schedule him.  Jose Barker DOB: 03/09/2005  Thanks!

## 2024-04-03 NOTE — Telephone Encounter (Signed)
 Patient states Dr. Micheal told him that Dr. Ozell may take on his wife a re-establishment patient. She used to be a patient of Dr. Luke and Dr. Charlett and wants to come back here.   Jose Barker DOB: 02/11/1977  Thanks!

## 2024-04-03 NOTE — Telephone Encounter (Signed)
 Message sent to Tammy as Dr Micheal stated he told the patient he could check with the front desk to see what providers are accepting new patients.  Dr Ozell is not accepting new patients.

## 2024-04-13 ENCOUNTER — Other Ambulatory Visit (INDEPENDENT_AMBULATORY_CARE_PROVIDER_SITE_OTHER)

## 2024-04-13 DIAGNOSIS — Z125 Encounter for screening for malignant neoplasm of prostate: Secondary | ICD-10-CM

## 2024-04-13 DIAGNOSIS — Z13220 Encounter for screening for lipoid disorders: Secondary | ICD-10-CM | POA: Diagnosis not present

## 2024-04-13 DIAGNOSIS — Z131 Encounter for screening for diabetes mellitus: Secondary | ICD-10-CM

## 2024-04-13 DIAGNOSIS — Z0000 Encounter for general adult medical examination without abnormal findings: Secondary | ICD-10-CM

## 2024-04-13 LAB — HEPATIC FUNCTION PANEL
ALT: 22 U/L (ref 0–53)
AST: 21 U/L (ref 0–37)
Albumin: 4.4 g/dL (ref 3.5–5.2)
Alkaline Phosphatase: 53 U/L (ref 39–117)
Bilirubin, Direct: 0.1 mg/dL (ref 0.0–0.3)
Total Bilirubin: 0.8 mg/dL (ref 0.2–1.2)
Total Protein: 7.2 g/dL (ref 6.0–8.3)

## 2024-04-13 LAB — BASIC METABOLIC PANEL WITH GFR
BUN: 13 mg/dL (ref 6–23)
CO2: 24 meq/L (ref 19–32)
Calcium: 9 mg/dL (ref 8.4–10.5)
Chloride: 104 meq/L (ref 96–112)
Creatinine, Ser: 0.9 mg/dL (ref 0.40–1.50)
GFR: 101.2 mL/min (ref >60.00)
Glucose, Bld: 96 mg/dL (ref 70–99)
Potassium: 4.2 meq/L (ref 3.5–5.1)
Sodium: 138 meq/L (ref 135–145)

## 2024-04-13 LAB — LIPID PANEL
Cholesterol: 268 mg/dL — ABNORMAL HIGH (ref 0–200)
HDL: 65.7 mg/dL (ref >39.00)
LDL Cholesterol: 169 mg/dL — ABNORMAL HIGH (ref 0–99)
NonHDL: 201.9
Total CHOL/HDL Ratio: 4
Triglycerides: 165 mg/dL — ABNORMAL HIGH (ref 0.0–149.0)
VLDL: 33 mg/dL (ref 0.0–40.0)

## 2024-04-13 LAB — CBC WITH DIFFERENTIAL/PLATELET
Basophils Absolute: 0.1 K/uL (ref 0.0–0.1)
Basophils Relative: 0.9 % (ref 0.0–3.0)
Eosinophils Absolute: 0.1 K/uL (ref 0.0–0.7)
Eosinophils Relative: 1.8 % (ref 0.0–5.0)
HCT: 44.8 % (ref 39.0–52.0)
Hemoglobin: 15.1 g/dL (ref 13.0–17.0)
Lymphocytes Relative: 29.8 % (ref 12.0–46.0)
Lymphs Abs: 1.6 K/uL (ref 0.7–4.0)
MCHC: 33.7 g/dL (ref 30.0–36.0)
MCV: 89 fl (ref 78.0–100.0)
Monocytes Absolute: 0.4 K/uL (ref 0.1–1.0)
Monocytes Relative: 7.9 % (ref 3.0–12.0)
Neutro Abs: 3.2 K/uL (ref 1.4–7.7)
Neutrophils Relative %: 59.6 % (ref 43.0–77.0)
Platelets: 248 K/uL (ref 150.0–400.0)
RBC: 5.04 Mil/uL (ref 4.22–5.81)
RDW: 12.8 % (ref 11.5–15.5)
WBC: 5.4 K/uL (ref 4.0–10.5)

## 2024-04-13 LAB — HEMOGLOBIN A1C: Hgb A1c MFr Bld: 5.5 % (ref 4.6–6.5)

## 2024-04-13 LAB — PSA: PSA: 0.54 ng/mL (ref 0.10–4.00)

## 2024-04-13 LAB — VITAMIN D 25 HYDROXY (VIT D DEFICIENCY, FRACTURES): VITD: 21.71 ng/mL — ABNORMAL LOW (ref 30.00–100.00)

## 2024-04-15 ENCOUNTER — Ambulatory Visit: Payer: Self-pay | Admitting: Family Medicine

## 2024-04-17 ENCOUNTER — Ambulatory Visit: Payer: Self-pay

## 2024-04-17 MED ORDER — VITAMIN D (ERGOCALCIFEROL) 1.25 MG (50000 UNIT) PO CAPS: 50000.0000 [IU] | ORAL_CAPSULE | ORAL | 0 refills | Status: DC

## 2024-04-17 NOTE — Telephone Encounter (Signed)
 Please see result note

## 2024-04-17 NOTE — Addendum Note (Signed)
 Addended by: METTA KRISTEN CROME on: 04/17/2024 10:17 AM   Modules accepted: Orders

## 2024-04-17 NOTE — Telephone Encounter (Signed)
 FYI Only or Action Required?: Action required by provider: clinical question for provider.  Patient was last seen in primary care on 04/03/2024 by Micheal Wolm ORN, MD.  Called Nurse Triage reporting Advice Only.  Symptoms began N/A.  Interventions attempted: Other: N/A.  Symptoms are: N/A.  Triage Disposition: Call PCP When Office is Open  Patient/caregiver understands and will follow disposition?: Yes                             Copied from CRM [id number]. Topic: Clinical - Lab/Test Results >> Apr 17, 2024  9:55 AM Martinique E wrote: Reason for CRM: Relayed note from PCP regarding labs, patient has further questions. Reason for Disposition  [1] Caller requesting NON-URGENT health information AND [2] PCP's office is the best resource  Answer Assessment - Initial Assessment Questions 1. REASON FOR CALL: What is the main reason for your call? or How can I best help you?     Patient has additional questions regarding recent lab results. This RN was not able to answer patient's specific questions. Per chart, CMA from office attempted to call patient this morning. This RN called [facility] to maintain closed loop communication. This RN was advised that PCP/team was not available at this time. Please advise.    Patient would like to know: 1. Is Vit D 50,000 IU going to be prescribed or can he find that OTC? 2. Due to elevated cholesterol, does he need to take any medications at this time?  Protocols used: Information Only Call - No Triage-A-AH

## 2024-04-17 NOTE — Telephone Encounter (Signed)
 Patient informed of the results and voiced understanding. Rx sent

## 2024-05-30 ENCOUNTER — Encounter: Payer: Self-pay | Admitting: Family Medicine

## 2024-07-06 ENCOUNTER — Other Ambulatory Visit: Payer: Self-pay | Admitting: Family Medicine
